# Patient Record
Sex: Male | Born: 1960 | Hispanic: Yes | Marital: Married | Smoking: Former smoker
Health system: Southern US, Community
[De-identification: ages and names within clinical notes are randomized; demographics above are authoritative.]

## PROBLEM LIST (undated history)

## (undated) DIAGNOSIS — E119 Type 2 diabetes mellitus without complications: Secondary | ICD-10-CM

## (undated) DIAGNOSIS — I10 Essential (primary) hypertension: Secondary | ICD-10-CM

---

## 2011-11-22 ENCOUNTER — Emergency Department: Payer: Self-pay | Admitting: Emergency Medicine

## 2011-11-22 LAB — COMPREHENSIVE METABOLIC PANEL
Albumin: 4.2 g/dL (ref 3.4–5.0)
Bilirubin,Total: 0.6 mg/dL (ref 0.2–1.0)
Calcium, Total: 9.7 mg/dL (ref 8.5–10.1)
Co2: 27 mmol/L (ref 21–32)
EGFR (African American): >60
Glucose: 107 mg/dL — ABNORMAL HIGH (ref 65–99)
Osmolality: 278 (ref 275–301)
Potassium: 3.6 mmol/L (ref 3.5–5.1)
Sodium: 139 mmol/L (ref 136–145)
Total Protein: 8.7 g/dL — ABNORMAL HIGH (ref 6.4–8.2)

## 2011-11-22 LAB — CBC
MCH: 29.7 pg (ref 26.0–34.0)
MCV: 85 fL (ref 80–100)
Platelet: 221 10*3/uL (ref 150–440)
RBC: 5.25 10*6/uL (ref 4.40–5.90)
RDW: 12.7 % (ref 11.5–14.5)

## 2011-11-22 LAB — URINALYSIS, COMPLETE
Bilirubin,UR: NEGATIVE
Blood: NEGATIVE
Ketone: NEGATIVE
Nitrite: NEGATIVE
Ph: 7 (ref 4.5–8.0)
RBC,UR: <1 /HPF (ref 0–5)
Specific Gravity: 1.009 (ref 1.003–1.030)
Squamous Epithelial: NONE SEEN
WBC UR: <1 /HPF (ref 0–5)

## 2012-06-27 LAB — CBC WITH DIFFERENTIAL/PLATELET
Eosinophil #: 0.1 10*3/uL (ref 0.0–0.7)
HCT: 35.7 % — ABNORMAL LOW (ref 40.0–52.0)
Lymphocyte #: 2.2 10*3/uL (ref 1.0–3.6)
Lymphocyte %: 18.3 %
MCHC: 34.7 g/dL (ref 32.0–36.0)
MCV: 85 fL (ref 80–100)
Monocyte #: 0.5 x10 3/mm (ref 0.2–1.0)
Neutrophil %: 76.8 %
Platelet: 214 10*3/uL (ref 150–440)
RBC: 4.2 10*6/uL — ABNORMAL LOW (ref 4.40–5.90)
RDW: 13.3 % (ref 11.5–14.5)
WBC: 12.2 10*3/uL — ABNORMAL HIGH (ref 3.8–10.6)

## 2012-06-27 LAB — COMPREHENSIVE METABOLIC PANEL
Albumin: 3.9 g/dL (ref 3.4–5.0)
BUN: 20 mg/dL — ABNORMAL HIGH (ref 7–18)
Bilirubin,Total: 0.4 mg/dL (ref 0.2–1.0)
Calcium, Total: 8.8 mg/dL (ref 8.5–10.1)
Chloride: 109 mmol/L — ABNORMAL HIGH (ref 98–107)
Co2: 23 mmol/L (ref 21–32)
Creatinine: 1.21 mg/dL (ref 0.60–1.30)
EGFR (Non-African Amer.): >60
Osmolality: 285 (ref 275–301)
Potassium: 4.2 mmol/L (ref 3.5–5.1)
SGPT (ALT): 32 U/L (ref 12–78)
Sodium: 140 mmol/L (ref 136–145)
Total Protein: 7.2 g/dL (ref 6.4–8.2)

## 2012-06-27 LAB — URINALYSIS, COMPLETE
Bilirubin,UR: NEGATIVE
Blood: NEGATIVE
Ketone: NEGATIVE
Nitrite: NEGATIVE
Ph: 5 (ref 4.5–8.0)
Protein: NEGATIVE

## 2012-06-28 ENCOUNTER — Inpatient Hospital Stay: Payer: Self-pay | Admitting: Internal Medicine

## 2012-06-28 LAB — CBC WITH DIFFERENTIAL/PLATELET
Basophil #: 0 10*3/uL (ref 0.0–0.1)
Basophil %: 0.5 %
Eosinophil #: 0.5 10*3/uL (ref 0.0–0.7)
HCT: 32.7 % — ABNORMAL LOW (ref 40.0–52.0)
HGB: 11.3 g/dL — ABNORMAL LOW (ref 13.0–18.0)
Lymphocyte #: 2.5 10*3/uL (ref 1.0–3.6)
Lymphocyte %: 33.4 %
MCH: 29.6 pg (ref 26.0–34.0)
MCHC: 34.5 g/dL (ref 32.0–36.0)
MCV: 86 fL (ref 80–100)
Monocyte #: 0.4 x10 3/mm (ref 0.2–1.0)
Monocyte %: 5.2 %
Neutrophil %: 54.6 %
Platelet: 178 10*3/uL (ref 150–440)
RBC: 3.8 10*6/uL — ABNORMAL LOW (ref 4.40–5.90)
RDW: 13 % (ref 11.5–14.5)
WBC: 7.6 10*3/uL (ref 3.8–10.6)

## 2012-06-29 LAB — BASIC METABOLIC PANEL
Anion Gap: 4 — ABNORMAL LOW (ref 7–16)
BUN: 10 mg/dL (ref 7–18)
Calcium, Total: 8.6 mg/dL (ref 8.5–10.1)
Co2: 29 mmol/L (ref 21–32)
Creatinine: 0.92 mg/dL (ref 0.60–1.30)
EGFR (African American): >60
EGFR (Non-African Amer.): >60
Glucose: 108 mg/dL — ABNORMAL HIGH (ref 65–99)
Potassium: 3.9 mmol/L (ref 3.5–5.1)

## 2012-06-29 LAB — CBC WITH DIFFERENTIAL/PLATELET
Basophil #: 0 10*3/uL (ref 0.0–0.1)
Basophil %: 0.7 %
Eosinophil #: 0.7 10*3/uL (ref 0.0–0.7)
Eosinophil %: 9.4 %
HGB: 11.5 g/dL — ABNORMAL LOW (ref 13.0–18.0)
Lymphocyte %: 23.7 %
MCH: 29.4 pg (ref 26.0–34.0)
MCHC: 34.3 g/dL (ref 32.0–36.0)
MCV: 86 fL (ref 80–100)
Monocyte #: 0.4 x10 3/mm (ref 0.2–1.0)
Neutrophil #: 4.2 10*3/uL (ref 1.4–6.5)
Platelet: 181 10*3/uL (ref 150–440)
RBC: 3.91 10*6/uL — ABNORMAL LOW (ref 4.40–5.90)

## 2012-06-29 LAB — TSH: Thyroid Stimulating Horm: 0.368 u[IU]/mL — ABNORMAL LOW

## 2014-05-07 NOTE — Consult Note (Signed)
PATIENT NAME:  [name], [name] MR#:  [id number] DATE OF BIRTH:  [birth year]  DATE OF CONSULTATION:  06/28/2012  REFERRING PHYSICIAN:   CONSULTING PHYSICIAN:  Midge Miniumarren Airam Heidecker, MD  CONSULTING SERVICE:  Gastroenterology.   REASON FOR CONSULTATION:  Rectal bleeding.   HISTORY OF PRESENT ILLNESS:  This patient is a 54 year old gentleman who reports that he had three episodes of rectal bleeding just prior to coming to the Emergency Department.  The patient states that he has never had the bleeding before.  He does report that 15 years ago he was told that he had a gastric ulcer.  The patient had a slight drop in his hemoglobin, but not dramatic.  The patient also denies any abdominal pain, fevers, chills, nausea or vomiting.  He does have a 10 pound weight loss over the last 8 months without trying.  He denies any family history of colon cancer, colon polyps.  He has never had a colonoscopy before.   PAST MEDICAL HISTORY:  Hypertension.   PAST SURGICAL HISTORY:  None.   ALLERGIES:  No known drug allergies.   HOME MEDICATIONS:  Lisinopril 10 mg a day.   SOCIAL HISTORY:  Denies tobacco, alcohol or drug use.   FAMILY HISTORY:  Noncontributory.    REVIEW OF SYSTEMS:   A 10 point review of systems negative except what is stated above.   PHYSICAL EXAMINATION:  GENERAL:   The patient is alert and orientated, answering questions appropriately, in no apparent distress.  VITAL SIGNS:  Temperature 98.5, pulse 60, respirations 18, blood pressure 167/94, pulse oximetry 100%.  HEENT:  Normocephalic, atraumatic.  Extraocular motor intact.  Pupils equally round and reactive to light and accommodation, without JVD, without lymphadenopathy.  LUNGS:  Clear to auscultation bilaterally.  HEART:  Regular rate and rhythm without murmurs, rubs or gallops.  ABDOMEN:  Soft, nontender, nondistended without hepatosplenomegaly.  EXTREMITIES:  Without cyanosis, clubbing or edema.  MUSCULOSKELETAL:  Good range of motion.   SKIN:  Without any rashes or lesion.  NEUROLOGICAL:  Grossly intact.   ANCILLARY SERVICES:  The patient's hemoglobin 12.4 on admission, 11.3 this morning.  Otherwise, he had a slightly elevated white cell count of 12.2, which is down to 7.6 this morning.   ASSESSMENT AND PLAN:  This patient is a 54 year old gentleman who has a history of one day of gastrointestinal bleeding from his rectum with three episodes happening prior to admission.  He has had no further gastrointestinal bleeding, diarrhea or other complaint since being in the hospital.  The patient has also had weight loss and never had a colonoscopy.  His history of peptic ulcer disease is unlikely the cause of his bleeding right now due to his relatively good hemoglobin and him not having hypotension.  The patient will be put on a clear liquid diet for today and tomorrow and start on a prep tomorrow for a colonoscopy on Monday.  The patient and his family have been explained the plan and agree with it.   Thank you very much for involving me in the care of this patient.  If you have any questions, please do not hesitate to call.    ____________________________ Midge Miniumarren Jabarri Stefanelli, MD dw:ea D: 06/28/2012 15:26:29 ET T: 06/29/2012 01:56:18 ET JOB#: [id number]  cc: Midge Miniumarren Victoire Deans, MD, <Dictator> Midge MiniumARREN Jodell Weitman MD ELECTRONICALLY SIGNED 06/29/2012 7:29

## 2014-05-07 NOTE — H&P (Signed)
PATIENT NAME:  [name], [name]  DATE OF ADMISSION:  06/28/2012  PRIMARY CARE PHYSICIAN: None.  REFERRING PHYSICIAN: Maricela BoLuna Ragsdale, MD   CHIEF COMPLAINT: Bright red blood per rectum.   HISTORY OF PRESENT ILLNESS: [name] is a 54 year old Hispanic male with a history of hypertension, who presented to the Emergency Department with complaints of bright red blood per rectum. The patient had 3 episodes prior to coming to the Emergency Department. They were small to large. The patient could not tell if this was mixed with stools. The patient states he did not have any previous episodes of GI bleed and did not have any colonoscopy in the past. Concerning this, the patient came to the Emergency Department. Workup in the Emergency Department: Initial hemoglobin was 11.3; however, the patient had CBC done in November 2013, and at that time, hemoglobin was 15.6. The patient denies having any abdominal pain, nausea, vomiting. The patient's blood pressure is well within normal limits; however, has been tachycardic. Denies using any over-the-counter pain medication.   PAST MEDICAL HISTORY: Hypertension.   PAST SURGICAL HISTORY: None.   ALLERGIES: No known drug allergies.   HOME MEDICATIONS: Lisinopril 10 mg once a day.   SOCIAL HISTORY: No history of smoking, drinking, alcohol or using illicit drugs.   FAMILY HISTORY: History of hypertension.   REVIEW OF SYSTEMS:  CONSTITUTIONAL: Denies having any generalized weakness, fatigue.  EYES: No change in vision.  EARS: No change in hearing.  RESPIRATORY: No cough, shortness of breath.  CARDIOVASCULAR: No chest pain, palpitations.  GASTROINTESTINAL: Has bright red blood per rectum. No abdominal pain.  GENITOURINARY: No dysuria or hematuria.  SKIN: No rash or lesions.  MUSCULOSKELETAL: No joint pains or aches.  ENDOCRINE: No polyuria or polydipsia.  NEUROLOGIC: No weakness or numbness in any part of the body.    PHYSICAL EXAMINATION:  GENERAL: This is a well-built, well-nourished, age-appropriate male lying down in the bed, not in distress.  VITAL SIGNS: Temperature 99.2, pulse 100, blood pressure 135/85, respiratory rate of 16, oxygen saturation is 98% on room air.  HEENT: Head normocephalic, atraumatic. Eyes: No scleral icterus. Conjunctivae normal. Pupils equal and reactive to light. Extraocular movements are intact. Mucous membranes moist. No pharyngeal erythema.  NECK: Supple. No lymphadenopathy. No JVD. No carotid bruit.  CHEST: Has no focal tenderness.  LUNGS: Bilaterally clear to auscultation.  HEART: S1 and S2 regular. No murmurs are heard. Tachycardia.  ABDOMEN: Bowel sounds present. Soft, nondistended. Has tenderness in the left lower quadrant. No guarding or rebound tenderness. Could not appreciate hepatosplenomegaly.  MUSCULOSKELETAL: Good range of motion.  SKIN: No rash or lesions.  NEUROLOGIC: The patient is alert, oriented to place, person and time. Cranial nerves II through XII intact. Motor 5/5 in upper and lower extremities.   LABORATORY DATA: CBC: WBC of 7.6, hemoglobin 11.3, platelet count of 214.   ASSESSMENT AND PLAN: [name] is a 54 year old male who comes to the Emergency Department with bright red blood per rectum.   1. Gastrointestinal bleed, seems to be most likely lower gastrointestinal as the patient has been hemodynamically stable for now. Significant drop in hemoglobin from 15 to 11.2. Will continue to follow up with H and H. Continue with IV fluids. Consult GI in the morning. If the patient continues to have recurrent bleeding and any signs of instability, will call GI emergently. Keep the patient n.p.o. for now.  2. Hypertension, well controlled. Will continue with the lisinopril once  we start him back on the diet.  3. Keep the patient on deep vein thrombosis prophylaxis with SCDs.  4. Anemia secondary to acute blood loss. Will continue to follow up. Will  transfuse for hemoglobin less than 8 or any rapid decline in the hemoglobin.   ____________________________ Nathaniel Griffins, MD pv:OSi D: 06/28/2012 07:20:09 ET T: 06/28/2012 08:41:07 ET JOB#: [id number]  cc: Nathaniel Griffins, MD, <Dictator> Nathaniel Griffins MD ELECTRONICALLY SIGNED 06/29/2012 8:09

## 2014-05-07 NOTE — Consult Note (Signed)
Brief Consult Note: Diagnosis: patient with h/o PUD 15 years ago now presented with LGIB with dark blood without any abd pain. He has lost 10 lbs in the last 8 months. No N/V. Has never had a colonoscopy. Bleeding was 3 times yesterday and has now resolved.   Patient was seen by consultant.   Consult note dictated.   Comments: Patient with lower GI bleeding. Will be put on clears today and tomorrow with prep tomorrow for a colonscopy on monday.  Electronic Signatures: Midge MiniumWohl, Denim Kalmbach (MD)  (Signed 14-Jun-14 12:15)  Authored: Brief Consult Note   Last Updated: 14-Jun-14 12:15 by Midge MiniumWohl, Lavone Weisel (MD)

## 2014-05-07 NOTE — Discharge Summary (Signed)
PATIENT NAME:  [name], [name] MR#:  [id number] DATE OF BIRTH:  [birth year]  DATE OF ADMISSION:  06/28/2012 DATE OF DISCHARGE:  06/30/2012  ADMITTING DIAGNOSIS: Gastrointestinal bleed.   DISCHARGE DIAGNOSES: 1.  Lower gastrointestinal bleed, status post colonoscopy on 06/30/2012 by Dr. Servando Snare showing  diverticulosis of entire colon, colon polyps which were resected and retrieved, internal hemorrhoids.  2.  Acute posthemorrhagic anemia.  3.  Leukocytosis, resolved.  4.  History of hypertension.   DISCHARGE CONDITION: Stable.   DISCHARGE MEDICATIONS: The patient is to resume lisinopril, however, the patient's dose was advanced to 10 mg p.o. twice daily dose.   NEW MEDICATIONS: Feosol 325 mg 1 tablet twice daily, Colace 100 mg p.o. twice daily, Senna 8.6 mg once daily, Anusol HC 25 mg suppository twice daily as needed.   HOME OXYGEN: None.   DIET: 2 grams salt high-fiber, regular consistency. The patient was recommended to avoid nuts, grains, as well as seeds.   ACTIVITY LIMITATIONS: As tolerated.   FOLLOW-UP APPOINTMENT: With Dr. Servando Snare in 1 week after discharge.   CONSULTANTS: Dr. Servando Snare, also Care Management.   LABORATORY AND RADIOLOGICAL STUDIES:  CT scan of abdomen and pelvis with contrast 06/27/2012 revealed no CT evidence of obstructive or inflammatory abnormalities.  The patient's lab data done on day of admission showed elevated glucose to 146 and BUN to 20, otherwise BMP was unremarkable. The patient's liver enzymes were normal. White blood cell count was slightly elevated to 12.2, hemoglobin was 12.4, platelet count was 214. Absolute neutrophil count was elevated at 9.3. Urinalysis was unremarkable.   HISTORY AND PHYSICAL:  The patient is a 54 year old Spanish male with past medical history significant for history of hypertension who presents to the hospital with complaints of bright red blood per rectum. Please refer to Dr. Clarita Leber admission note on 06/28/2012.     On arrival to  the hospital, the patient's temperature was 99.2, pulse was 100, respiration rate was 16, blood pressure 135/85, saturation was 98% on room air. Physical exam was unremarkable.   HOSPITAL COURSE:  The patient was admitted to the hospital for further evaluation.  Because of his history of diarrheal stool, he also had stool for C. diff checked which was negative. He was evaluated by gastroenterologist who felt the patient would benefit from colonoscopy. Colonoscopy was performed on 06/29/2012.  Diverticulosis of the entire examined colon was found.  Two 7 to 12 mm polyps in the transverse colon and the cecum were resected and retrieved.  Nonbleeding internal hemorrhoids were also noted.  Dr. Servando Snare recommended to repeat colonoscopy in 5 years if polyp reveals adenoma, in 10 years if hyperplastic polyp.  Postprocedure, the patient had no bleeding, and his diet was advanced, and he is being discharged home in stable condition with no  significant symptoms.   His vital signs on the day of discharge: Temperature 97.6 pulse was 60s to 70s, respiration rate was 18, blood pressure 120s to 140s systolic and 70s to 80s diastolic, and O2 saturations were 98 to 100% on room air at rest.   In regards to GI bleed, it was unclear where it was coming from; however, internal hemorrhoids as well as diverticulosis were implicated.   In regards to acute posthemorrhagic anemia, the patient's hemoglobin level was found to be 12.4 on the day of admission, 06/27/2012.  With  IV fluid administration, the  patient's hemoglobin level drifted somewhat lower; however, it remained stable over the past 3 days; and on the day of discharge,  06/30/2012, it was 11.3. The patient was advised to continue iron supplements for his anemia.   In regards to hypertension, the patient's blood pressure was found to be somewhat poorly controlled, and his lisinopril was advanced to twice daily dose. It is recommended to follow the patient's blood pressure  readings and make decisions about advancement of his medications if needed.  For leukocytosis, it was unclear why the patient had mild leukocytosis on admission 06/27/2012; however, leukocytosis resolved already on the next day, 06/28/2012 with no antibiotics.   The patient is being discharged in stable condition with the above mentioned medications and follow-up.   TIME SPENT: 40 minutes.    ____________________________ Katharina Caperima Leianne Callins, MD rv:cb D: 06/30/2012 16:46:03 ET T: 07/01/2012 00:01:42 ET JOB#: [id number]  cc: Katharina Caperima Arad Burston, MD, <Dictator> Midge Miniumarren Wohl, MD Daegan Arizmendi MD ELECTRONICALLY SIGNED 07/21/2012 12:28

## 2014-05-07 NOTE — Consult Note (Signed)
Chief Complaint:  Subjective/Chief Complaint Patient without any further bowel movements or bleeding since admission.   VITAL SIGNS/ANCILLARY NOTES: **Vital Signs.:   15-Jun-14 03:48  Vital Signs Type Routine  Temperature Temperature (F) 97.3  Celsius 36.2  Temperature Source oral  Pulse Pulse 55  Respirations Respirations 18  Systolic BP Systolic BP 834  Diastolic BP (mmHg) Diastolic BP (mmHg) 87  Mean BP 100  Pulse Ox % Pulse Ox % 98  Pulse Ox Activity Level  At rest  Oxygen Delivery Room Air/ 21 %   Brief Assessment:  GEN well developed   Cardiac Regular   Respiratory normal resp effort   Gastrointestinal Normal   Lab Results: Thyroid:  15-Jun-14 04:47   Thyroid Stimulating Hormone  0.368 (0.45-4.50 (International Unit)  ----------------------- Pregnant patients have  different reference  ranges for TSH:  - - - - - - - - - -  Pregnant, first trimetser:  0.36 - 2.50 uIU/mL)  Routine Chem:  15-Jun-14 04:47   Glucose, Serum  108  BUN 10  Creatinine (comp) 0.92  Sodium, Serum 141  Potassium, Serum 3.9  Chloride, Serum  108  CO2, Serum 29  Calcium (Total), Serum 8.6  Anion Gap  4  Osmolality (calc) 281  eGFR (African American) >60  eGFR (Non-African American) >60 (eGFR values <78m/min/1.73 m2 may be an indication of chronic kidney disease (CKD). Calculated eGFR is useful in patients with stable renal function. The eGFR calculation will not be reliable in acutely ill patients when serum creatinine is changing rapidly. It is not useful in  patients on dialysis. The eGFR calculation may not be applicable to patients at the low and high extremes of body sizes, pregnant women, and vegetarians.)  Hemoglobin A1c ([facility]) 6.3 (The American Diabetes Association recommends that a primary goal of therapy should be <7% and that physicians should reevaluate the treatment regimen in patients with HbA1c values consistently >8%.)  Routine Hem:  15-Jun-14 04:47   WBC  (CBC) 7.0  RBC (CBC)  3.91  Hemoglobin (CBC)  11.5  Hematocrit (CBC)  33.5  Platelet Count (CBC) 181  MCV 86  MCH 29.4  MCHC 34.3  RDW 12.9  Neutrophil % 60.6  Lymphocyte % 23.7  Monocyte % 5.6  Eosinophil % 9.4  Basophil % 0.7  Neutrophil # 4.2  Lymphocyte # 1.7  Monocyte # 0.4  Eosinophil # 0.7  Basophil # 0.0 (Result(s) reported on 29 Jun 2012 at 05:50AM.)   Assessment/Plan:  Assessment/Plan:  Assessment Lower GI bleed.   Plan Patient has had no further bleeding and his Hb has been stable. Patient to have colonoscopy tomorrow.   Electronic Signatures: WLucilla Lame(MD)  (Signed 15-Jun-14 07:46)  Authored: Chief Complaint, VITAL SIGNS/ANCILLARY NOTES, Brief Assessment, Lab Results, Assessment/Plan   Last Updated: 15-Jun-14 07:46 by WLucilla Lame(MD)

## 2015-01-01 IMAGING — CT CT ABD-PELV W/ CM
1 of 4 series · 13 of 32 positions shown, 19 images · IV contrast (isovue)
Comparison: none

REASON FOR EXAM: (1) LLQ tenderness; (2) LLQ tenderness
COMMENTS:

PROCEDURE:     CT  - CT ABDOMEN / PELVIS  W  - June 27, 2012 [DATE]
RESULT:     Technique: Helical 3 mm sections were obtained from the lung
bases through the superior iliac crest status post intravenous
administration of 100 mL Isovue 370.

[Series 2: 3mm soft tissue · axial · 0.78mm/px · z∈[-526,-118]mm · 13 of 158 slices shown, 19 images]
[im 11/158  soft-tissue]
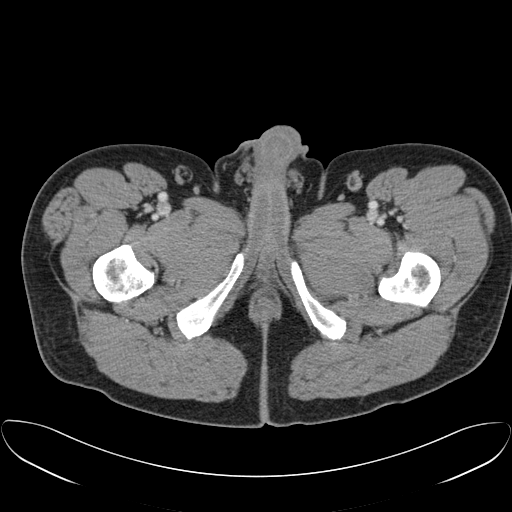
[im 11/158  bone]
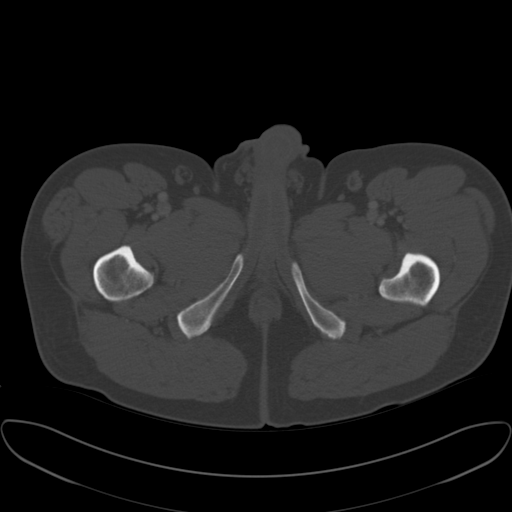
[im 21/158  soft-tissue]
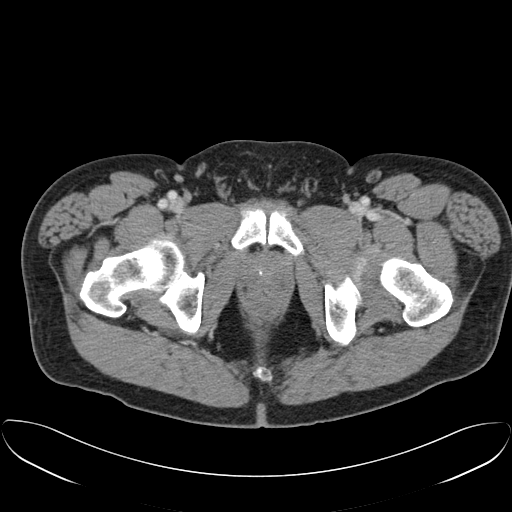
[im 32/158  soft-tissue]
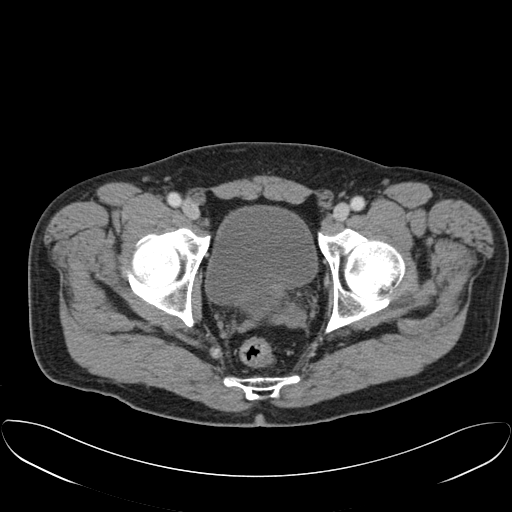
[im 42/158  soft-tissue]
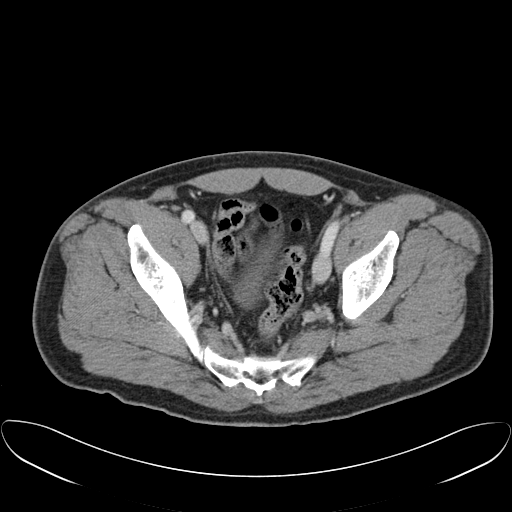
[im 53/158  soft-tissue]
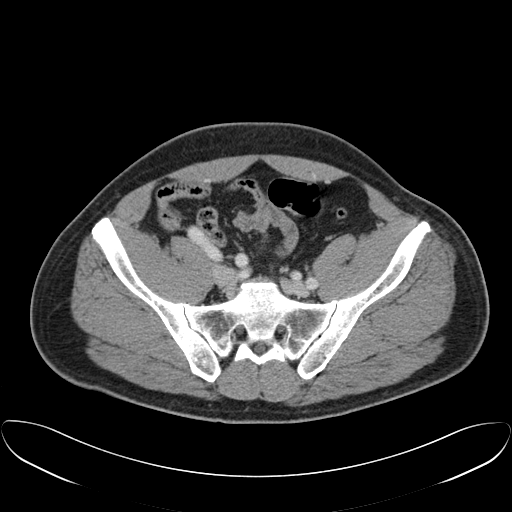
[im 63/158  soft-tissue]
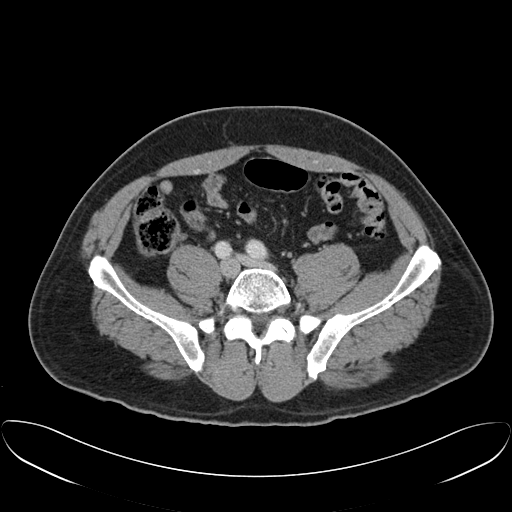
[im 84/158  soft-tissue]
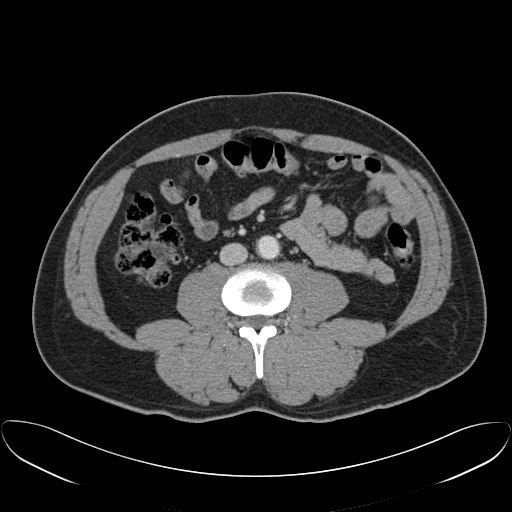
[im 95/158  soft-tissue]
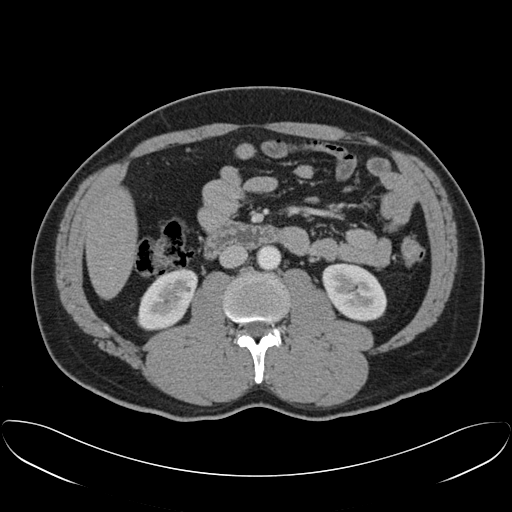
[im 105/158  soft-tissue]
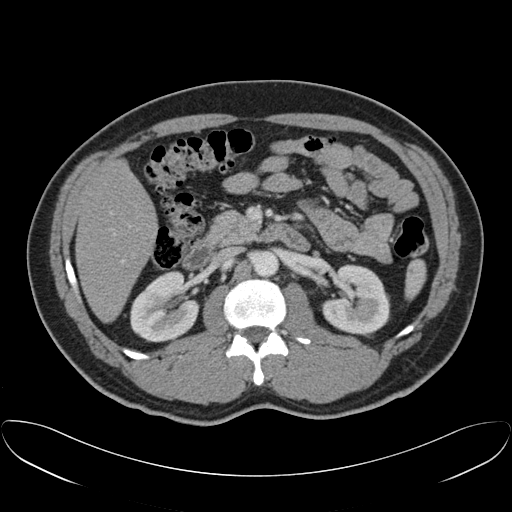
[im 105/158  bone]
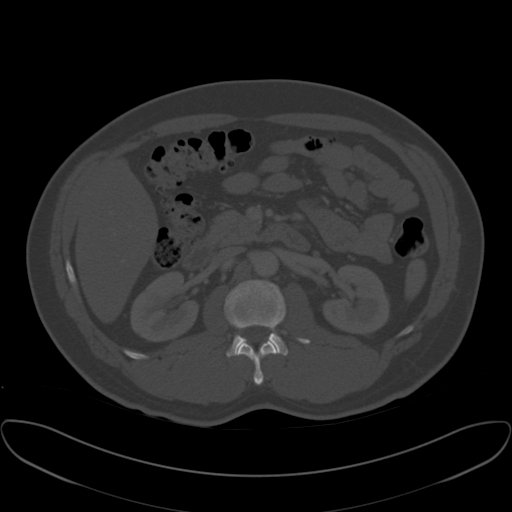
[im 116/158  soft-tissue]
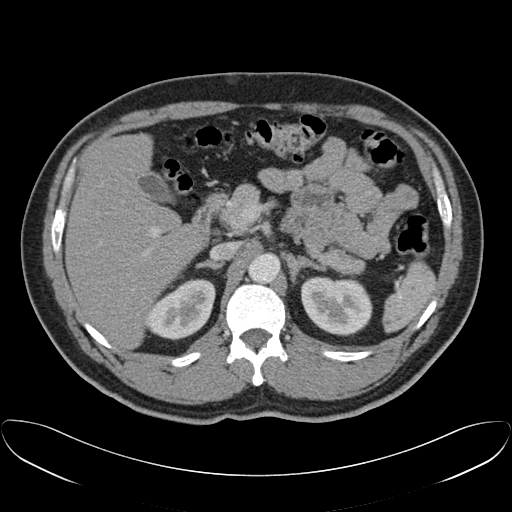
[im 116/158  lung]
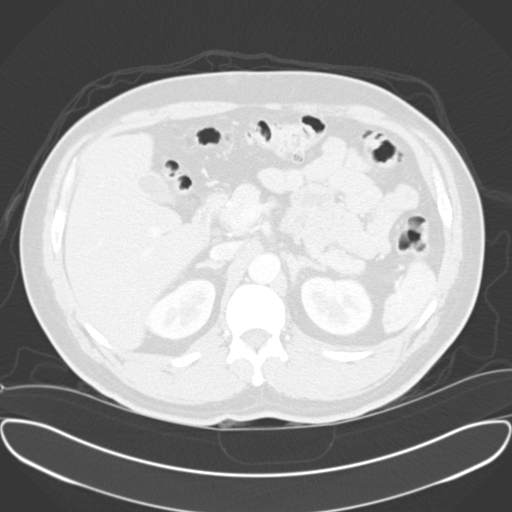
[im 126/158  soft-tissue]
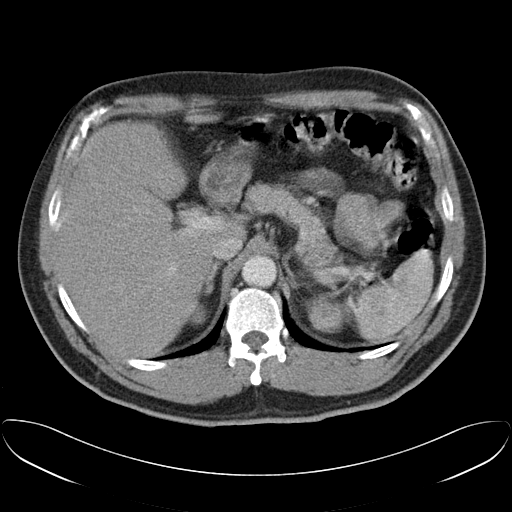
[im 126/158  lung]
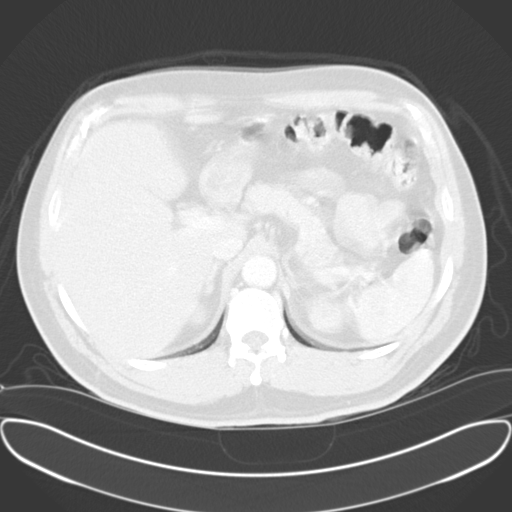
[im 137/158  soft-tissue]
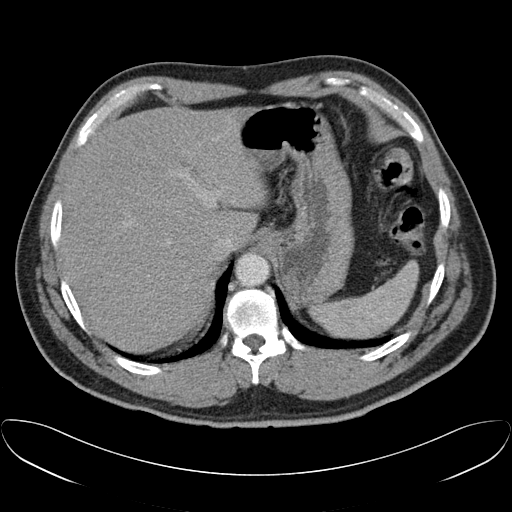
[im 137/158  lung]
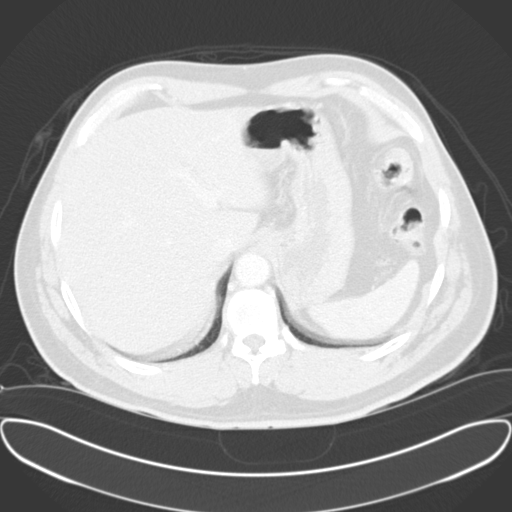
[im 147/158  soft-tissue]
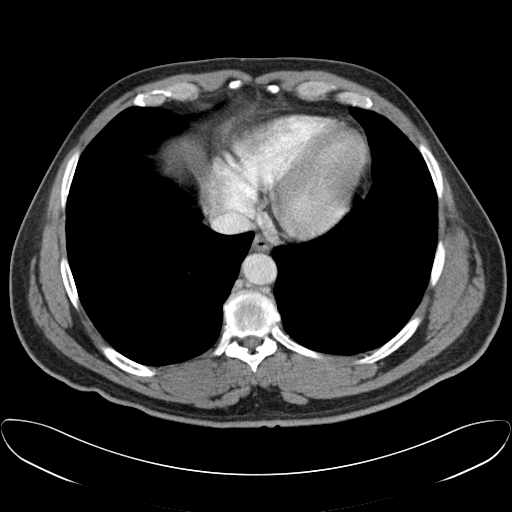
[im 147/158  lung]
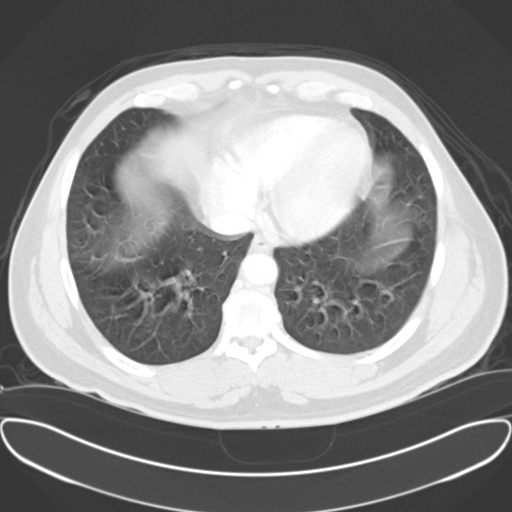

[13 of 32 positions shown; findings below may reference images not displayed]

FINDINGS: The lung bases are unremarkable.

The liver, spleen, adrenals, pancreas, and kidneys are unremarkable.

There is not evidence of bowel obstruction, enteritis, colitis nor
diverticulitis.

There is not evidence of an abdominal aortic aneurysm. The celiac, SMA, IMA,
portal vein, and SMV are opacified.

The gallbladder fossa is unremarkable.

There is no evidence of abdominal free fluid, loculated fluid collections,
masses nor adenopathy.
IMPRESSION: No CT evidence of obstructive or inflammatory abnormalities.
2. Dr. Mandavela of the emergency department was informed of these findings
via a preliminary faxed report.

## 2020-01-18 ENCOUNTER — Ambulatory Visit (INDEPENDENT_AMBULATORY_CARE_PROVIDER_SITE_OTHER): Payer: BLUE CROSS/BLUE SHIELD

## 2020-01-18 ENCOUNTER — Ambulatory Visit
Admission: EM | Admit: 2020-01-18 | Discharge: 2020-01-18 | Disposition: A | Discharge status: Home / Self Care | Payer: BLUE CROSS/BLUE SHIELD | Attending: Physician Assistant | Admitting: Physician Assistant

## 2020-01-18 ENCOUNTER — Other Ambulatory Visit: Payer: Self-pay

## 2020-01-18 ENCOUNTER — Encounter: Payer: Self-pay | Admitting: Emergency Medicine

## 2020-01-18 DIAGNOSIS — I10 Essential (primary) hypertension: Secondary | ICD-10-CM

## 2020-01-18 DIAGNOSIS — R208 Other disturbances of skin sensation: Secondary | ICD-10-CM | POA: Diagnosis present

## 2020-01-18 DIAGNOSIS — R079 Chest pain, unspecified: Secondary | ICD-10-CM | POA: Diagnosis not present

## 2020-01-18 HISTORY — DX: Type 2 diabetes mellitus without complications: E11.9

## 2020-01-18 HISTORY — DX: Essential (primary) hypertension: I10

## 2020-01-18 LAB — COMPREHENSIVE METABOLIC PANEL
ALT: 26 U/L (ref 0–44)
AST: 18 U/L (ref 15–41)
Albumin: 4.2 g/dL (ref 3.5–5.0)
Alkaline Phosphatase: 63 U/L (ref 38–126)
Anion gap: 12 (ref 5–15)
BUN: 16 mg/dL (ref 6–20)
CO2: 25 mmol/L (ref 22–32)
Calcium: 9 mg/dL (ref 8.9–10.3)
Chloride: 102 mmol/L (ref 98–111)
Creatinine, Ser: 0.92 mg/dL (ref 0.61–1.24)
GFR, Estimated: >60 mL/min (ref >60)
Glucose, Bld: 101 mg/dL — ABNORMAL HIGH (ref 70–99)
Potassium: 3.8 mmol/L (ref 3.5–5.1)
Sodium: 139 mmol/L (ref 135–145)
Total Bilirubin: 0.5 mg/dL (ref 0.3–1.2)
Total Protein: 7.8 g/dL (ref 6.5–8.1)

## 2020-01-18 LAB — CBC WITH DIFFERENTIAL/PLATELET
Abs Immature Granulocytes: 0.02 10*3/uL (ref 0.00–0.07)
Basophils Absolute: 0.1 10*3/uL (ref 0.0–0.1)
Basophils Relative: 1 %
Eosinophils Absolute: 1.4 10*3/uL — ABNORMAL HIGH (ref 0.0–0.5)
Eosinophils Relative: 16 %
HCT: 45.8 % (ref 39.0–52.0)
Hemoglobin: 15.5 g/dL (ref 13.0–17.0)
Immature Granulocytes: 0 %
Lymphocytes Relative: 30 %
Lymphs Abs: 2.6 10*3/uL (ref 0.7–4.0)
MCH: 28.9 pg (ref 26.0–34.0)
MCHC: 33.8 g/dL (ref 30.0–36.0)
MCV: 85.4 fL (ref 80.0–100.0)
Monocytes Absolute: 0.6 10*3/uL (ref 0.1–1.0)
Monocytes Relative: 7 %
Neutro Abs: 4 10*3/uL (ref 1.7–7.7)
Neutrophils Relative %: 46 %
Platelets: 210 10*3/uL (ref 150–400)
RBC: 5.36 MIL/uL (ref 4.22–5.81)
RDW: 12.7 % (ref 11.5–15.5)
WBC: 8.6 10*3/uL (ref 4.0–10.5)
nRBC: 0 % (ref 0.0–0.2)

## 2020-01-18 LAB — TROPONIN I (HIGH SENSITIVITY): Troponin I (High Sensitivity): 6 ng/L (ref <18)

## 2020-01-18 MED ORDER — LISINOPRIL 20 MG PO TABS: 20.0000 mg | ORAL_TABLET | Freq: Every day | ORAL | 0 refills | Status: DC

## 2020-01-18 NOTE — Discharge Instructions (Addendum)
Your chest x-ray is normal.  An EKG shows a right bundle branch block.  I am not sure if this is new or not.  This is something to follow-up with your PCP about and get repeat EKG.  If you have any increased pain in your chest or breathing difficulty or any dizziness or weakness you must call EMS or go to ED.  CBC and CMP are both normal.  I did also perform a troponin which is a cardiac enzyme. It was normal.  As discussed I think it is possible that you could have shingles without the rash. If that is the case it should get better with a little more time.  There are other conditions that can cause this disturbing sensation of your skin.  I would advise a follow-up with your PCP at the next available appointment for further work-up.  And putting back on your lisinopril.  Keep a log of blood pressures and follow-up with PCP.

## 2020-01-18 NOTE — ED Provider Notes (Signed)
[facility]    CSN: [id number] Arrival date & time: 01/18/20  1840      History   Chief Complaint Chief Complaint  Patient presents with  . Hypertension       . burning senstation of skin    HPI [name] is a 60 y.o. male presenting with daughter for concerns about elevated blood pressures.  Patient's daughter is helping to translate since the patient is Spanish-speaking only.  She says that his blood pressure was 176/113 when she checked it recently.  She states that he has been more anxious than normal.  She states that he is complaining of burning sensation of the left chest constantly for about 4 weeks.  He states that the pain is little worse when he takes a deep breath or twists and turns.  Nothing improves the discomfort.  This pain is not associated with any fever, fatigue, sweats, shortness of breath, palpitations, leg swelling, abdominal pain, heartburn, nausea/vomiting, or weakness.  They deny history of rash.  He does have a history of chickenpox about 20 years ago.  He does have history of hypertension and states that his PCP took him off of lisinopril a few months ago since his blood pressures were doing well.  He also has a history of prediabetes according to his daughter.  Patient last seen by PCP about a month ago and states he had normal exam and labs. They have no other concerns today.  HPI  Past Medical History:  Diagnosis Date  . Diabetes mellitus without complication (HCC)   . Hypertension     There are no problems to display for this patient.   History reviewed. No pertinent surgical history.     Home Medications    Prior to Admission medications   Medication Sig Start Date End Date Taking? Authorizing Provider  lisinopril (ZESTRIL) 20 MG tablet Take 1 tablet (20 mg total) by mouth daily. 01/18/20 04/17/20 Yes Shirlee Latch, PA-C  metFORMIN (GLUCOPHAGE-XR) 500 MG 24 hr tablet Take by mouth. 08/30/16  Yes [provider]     Family History History reviewed. No pertinent family history.  Social History Social History   Tobacco Use  . Smoking status: Former Games developer  . Smokeless tobacco: Never Used  Vaping Use  . Vaping Use: Never used  Substance Use Topics  . Alcohol use: Not Currently  . Drug use: Not Currently     Allergies   Patient has no known allergies.   Review of Systems Review of Systems  Constitutional: Negative for fatigue and fever.  HENT: Negative for congestion.   Eyes: Negative for visual disturbance.  Respiratory: Negative for cough, chest tightness, shortness of breath and wheezing.   Cardiovascular: Positive for chest pain (burning chest wall pain, sensitive skin). Negative for palpitations and leg swelling.  Gastrointestinal: Negative for abdominal pain, nausea and vomiting.  Musculoskeletal: Negative for back pain and myalgias.  Skin: Negative for color change and rash.  Neurological: Negative for dizziness, tremors, facial asymmetry, weakness, light-headedness, numbness and headaches.  Psychiatric/Behavioral: Negative for confusion.     Physical Exam Triage Vital Signs ED Triage Vitals  Enc Vitals Group     BP 01/18/20 2004 (!) 192/96     Pulse Rate 01/18/20 2004 65     Resp 01/18/20 2004 18     Temp 01/18/20 2004 98 F (36.7 C)     Temp Source 01/18/20 2004 Oral     SpO2 01/18/20 2004 99 %  Weight 01/18/20 1942 190 lb (86.2 kg)     Height 01/18/20 1942 5\' 6"  (1.676 m)     Head Circumference --      Peak Flow --      Pain Score 01/18/20 1941 6     Pain Loc --      Pain Edu? --      Excl. in GC? --    No data found.  Updated Vital Signs BP (!) 191/110 (BP Location: Right Arm)   Pulse 65   Temp 98 F (36.7 C) (Oral)   Resp 18   Ht 5\' 6"  (1.676 m)   Wt 190 lb (86.2 kg)   SpO2 99%   BMI 30.67 kg/m       Physical Exam Vitals and nursing note reviewed.  Constitutional:      General: He is not in acute distress.    Appearance: Normal  appearance. He is well-developed and well-nourished. He is not ill-appearing or toxic-appearing.  HENT:     Head: Normocephalic and atraumatic.     Mouth/Throat:     Mouth: Mucous membranes are moist.     Pharynx: Oropharynx is clear.  Eyes:     General: No scleral icterus.       Right eye: No discharge.     Conjunctiva/sclera: Conjunctivae normal.     Pupils: Pupils are equal, round, and reactive to light.  Cardiovascular:     Rate and Rhythm: Normal rate and regular rhythm.     Heart sounds: No murmur heard.   Pulmonary:     Effort: Pulmonary effort is normal. No respiratory distress.     Breath sounds: Normal breath sounds. No wheezing, rhonchi or rales.  Chest:     Chest wall: Tenderness (TTP with light touch of skin along T5-T6  left side, no rashes or skin discoloration. No swelling) present.  Abdominal:     Palpations: Abdomen is soft.     Tenderness: There is no abdominal tenderness.  Musculoskeletal:        General: No edema.     Cervical back: Neck supple.     Thoracic back: No tenderness. Normal range of motion.     Lumbar back: No tenderness. Normal range of motion.  Skin:    General: Skin is warm and dry.  Neurological:     General: No focal deficit present.     Mental Status: He is alert and oriented to person, place, and time. Mental status is at baseline.     Cranial Nerves: No cranial nerve deficit.     Motor: No weakness.     Gait: Gait normal.  Psychiatric:        Mood and Affect: Mood and affect and mood normal.        Behavior: Behavior normal.        Thought Content: Thought content normal.      UC Treatments / Results  Labs (all labs ordered are listed, but only abnormal results are displayed) Labs Reviewed  CBC WITH DIFFERENTIAL/PLATELET - Abnormal; Notable for the following components:      Result Value   Eosinophils Absolute 1.4 (*)    All other components within normal limits  COMPREHENSIVE METABOLIC PANEL - Abnormal; Notable for the  following components:   Glucose, Bld 101 (*)    All other components within normal limits  TROPONIN I (HIGH SENSITIVITY)    EKG   Radiology DG Chest 2 View  Result Date: 01/18/2020 CLINICAL DATA:  Hypertension.  Left chest pain. EXAM: CHEST - 2 VIEW COMPARISON:  November 22, 2011 FINDINGS: The heart size and mediastinal contours are within normal limits. Both lungs are clear. The visualized skeletal structures are unremarkable. IMPRESSION: No active cardiopulmonary disease. Electronically Signed   By: Katherine Mantle M.D.   On: 01/18/2020 21:02    Procedures ED EKG  Date/Time: 01/18/2020 9:10 PM Performed by: Shirlee Latch, PA-C Authorized by: Shirlee Latch, PA-C   Previous ECG:    Previous ECG:  Unavailable Interpretation:    Interpretation: abnormal   Rate:    ECG rate:  62   ECG rate assessment: normal   Rhythm:    Rhythm: sinus rhythm   Ectopy:    Ectopy: none   QRS:    QRS axis:  Normal   QRS intervals:  Normal   QRS conduction: RBBB   ST segments:    ST segments:  Normal T waves:    T waves: normal   Comments:     Sinus rhythm, regular rate, RBBB   (including critical care time)  Medications Ordered in UC Medications - No data to display  Initial Impression / Assessment and Plan / UC Course  I have reviewed the triage vital signs and the nursing notes.  Pertinent labs & imaging results that were available during my care of the patient were reviewed by me and considered in my medical decision making (see chart for details).   60 year old male presenting for elevated blood pressures.  Blood pressure is elevated at 191/110 in the clinic.  All other vital signs are normal and stable.  He states he was previously on lisinopril.  Unable to access other medical records to review.  Patient also does complain of "chest burning" that is constant over the past month.  He has sensitivity along the T4-T5 dermatome with light palpation.  There are no overlying  rashes or swelling.  His chest is clear to auscultation and heart regular rate and rhythm.  EKG performed today is normal sinus rhythm and rate with right bundle branch block.  Unable to view any previous EKGs to determine if this is new finding or not.  Chest x-ray performed and is within normal limits. Independently reviewed by me.  CBC is within normal limits today. CMP performed and glucose elevated at 101, otherwise normal labs Troponin performed and WNL  Top differentials include zoster sine herpete, herniated disc of t-spine or other spinal condition, vitamin deficiency, electrolyte imbalance, other neurological condition  Discussed all test results with patient and his daughter.  They had a chance to ask questions.  Advised him that he should follow-up with his PCP regarding the right bundle branch block on the EKG and this skin sensation.  ED precautions thoroughly reviewed with patient and daughter.  Final Clinical Impressions(s) / UC Diagnoses   Final diagnoses:  Essential hypertension  Burning sensation of skin     Discharge Instructions     Your chest x-ray is normal.  An EKG shows a right bundle branch block.  I am not sure if this is new or not.  This is something to follow-up with your PCP about and get repeat EKG.  If you have any increased pain in your chest or breathing difficulty or any dizziness or weakness you must call EMS or go to ED.  CBC and CMP are both normal.  I did also perform a troponin which is a cardiac enzyme. It was normal.  As discussed I think it is  possible that you could have shingles without the rash. If that is the case it should get better with a little more time.  There are other conditions that can cause this disturbing sensation of your skin.  I would advise a follow-up with your PCP at the next available appointment for further work-up.  And putting back on your lisinopril.  Keep a log of blood pressures and follow-up with PCP.    ED  Prescriptions    Medication Sig Dispense Auth. Provider   lisinopril (ZESTRIL) 20 MG tablet Take 1 tablet (20 mg total) by mouth daily. 90 tablet Gareth Morgan     PDMP not reviewed this encounter.   Shirlee Latch, PA-C 01/19/20 [phone]

## 2020-01-18 NOTE — ED Triage Notes (Signed)
Pt c/o burning sensation on his left nipple, and elevated BP/ daughter states he has been anxious and his BP has been 176/113. BP started today and the burning sensation started about 4 weeks ago.

## 2020-01-18 NOTE — ED Notes (Signed)
EKG performed and given to [name]

## 2021-02-01 ENCOUNTER — Other Ambulatory Visit: Payer: Self-pay

## 2021-02-01 ENCOUNTER — Ambulatory Visit
Admission: EM | Admit: 2021-02-01 | Discharge: 2021-02-01 | Disposition: A | Discharge status: Home / Self Care | Payer: BLUE CROSS/BLUE SHIELD | Attending: Emergency Medicine | Admitting: Emergency Medicine

## 2021-02-01 DIAGNOSIS — Z760 Encounter for issue of repeat prescription: Secondary | ICD-10-CM

## 2021-02-01 DIAGNOSIS — I10 Essential (primary) hypertension: Secondary | ICD-10-CM

## 2021-02-01 MED ORDER — LISINOPRIL 20 MG PO TABS: 20.0000 mg | ORAL_TABLET | Freq: Every day | ORAL | 0 refills | Status: DC

## 2021-02-01 NOTE — ED Triage Notes (Signed)
Patient is here for "High Blood Pressure". Taken at home "this morning" (160/95?). History of High Blood Pressure during previous visit, d/c'd previous BP med by PCP. ? Needs to restart (per patient). No ha. No dizziness.

## 2021-02-01 NOTE — Discharge Instructions (Addendum)
We will give patient a refill for blood pressure medicine.  He states that he has metformin still does not need a refill on this. Will send in for assistance for patient to have a new PCP per his request. Expressed to patient that his blood pressure gets elevated at the weakness to 1 side or a headache he needs to be seen in the emergency room.

## 2021-02-01 NOTE — ED Provider Notes (Signed)
[facility]    CSN: [id number] Arrival date & time: 02/01/21  0855      History   Chief Complaint Chief Complaint  Patient presents with   Hypertension    HPI [name] is a 61 y.o. male.   Patient is here for refill on lisinopril blood pressure medicine.  States that when he was seen here we placed him on medications he had done fine.  Seen his PCP his PCP did not want him to continue the blood pressure medicine patient reports PCP told him that his blood pressure was fine without the medication.  Patient states that he does better on the medicines.  Patient currently does not have no headache no chest pain no symptoms.  Patient is requesting to have a new PCP that would fill the medication for him.   Past Medical History:  Diagnosis Date   Diabetes mellitus without complication ([place])    Hypertension     There are no problems to display for this patient.   History reviewed. No pertinent surgical history.     Home Medications    Prior to Admission medications   Medication Sig Start Date End Date Taking? Authorizing Provider  lisinopril (ZESTRIL) 20 MG tablet Take 1 tablet (20 mg total) by mouth daily. 02/01/21 05/02/21  Marney Setting, NP  metFORMIN (GLUCOPHAGE-XR) 500 MG 24 hr tablet Take by mouth. 08/30/16   [provider]    Family History No family history on file.  Social History Social History   Tobacco Use   Smoking status: Former   Smokeless tobacco: Never  Scientific laboratory technician Use: Never used  Substance Use Topics   Alcohol use: Not Currently   Drug use: Not Currently     Allergies   Patient has no known allergies.   Review of Systems Review of Systems  Constitutional: Negative.   Respiratory: Negative.    Cardiovascular: Negative.   Gastrointestinal: Negative.   Genitourinary: Negative.   Musculoskeletal: Negative.   Neurological: Negative.     Physical Exam Triage Vital Signs ED Triage Vitals  Enc  Vitals Group     BP 02/01/21 0938 (!) 190/106     Pulse Rate 02/01/21 0938 83     Resp 02/01/21 0938 18     Temp 02/01/21 0938 98.9 F (37.2 C)     Temp Source 02/01/21 0938 Oral     SpO2 02/01/21 0938 100 %     Weight 02/01/21 0942 180 lb (81.6 kg)     Height 02/01/21 0942 5\' 1"  (1.549 m)     Head Circumference --      Peak Flow --      Pain Score 02/01/21 0938 0     Pain Loc --      Pain Edu? --      Excl. in [place]? --    No data found.  Updated Vital Signs BP (!) 167/115 (BP Location: Left Arm) Comment: #2 of 2   Pulse 83    Temp 98.9 F (37.2 C) (Oral)    Resp 18    Ht 5\' 1"  (1.549 m)    Wt 180 lb (81.6 kg)    SpO2 100%    BMI 34.01 kg/m   Visual Acuity Right Eye Distance:   Left Eye Distance:   Bilateral Distance:    Right Eye Near:   Left Eye Near:    Bilateral Near:     Physical Exam Constitutional:  Appearance: Normal appearance.  HENT:     Right Ear: Tympanic membrane normal.     Left Ear: Tympanic membrane normal.  Eyes:     Pupils: Pupils are equal, round, and reactive to light.  Cardiovascular:     Rate and Rhythm: Normal rate.  Pulmonary:     Effort: Pulmonary effort is normal.  Abdominal:     General: Abdomen is flat.  Skin:    General: Skin is warm.  Neurological:     General: No focal deficit present.     Mental Status: He is alert. Mental status is at baseline.     UC Treatments / Results  Labs (all labs ordered are listed, but only abnormal results are displayed) Labs Reviewed - No data to display  EKG   Radiology No results found.  Procedures Procedures (including critical care time)  Medications Ordered in UC Medications - No data to display  Initial Impression / Assessment and Plan / UC Course  I have reviewed the triage vital signs and the nursing notes.  Pertinent labs & imaging results that were available during my care of the patient were reviewed by me and considered in my medical decision making (see chart for  details).     We will give patient a refill for blood pressure medicine.  He states that he has metformin still does not need a refill on this. Will send in for assistance for patient to have a new PCP per his request. Expressed to patient that his blood pressure gets elevated at the weakness to 1 side or a headache he needs to be seen in the emergency room. Final Clinical Impressions(s) / UC Diagnoses   Final diagnoses:  Hypertension, unspecified type  Medication refill     Discharge Instructions      We will give patient a refill for blood pressure medicine.  He states that he has metformin still does not need a refill on this. Will send in for assistance for patient to have a new PCP per his request. Expressed to patient that his blood pressure gets elevated at the weakness to 1 side or a headache he needs to be seen in the emergency room.     ED Prescriptions     Medication Sig Dispense Auth. Provider   lisinopril (ZESTRIL) 20 MG tablet Take 1 tablet (20 mg total) by mouth daily. 90 tablet Marney Setting, NP      PDMP not reviewed this encounter.   Marney Setting, NP 02/01/21 1125

## 2021-02-02 ENCOUNTER — Telehealth: Payer: Self-pay

## 2021-02-02 NOTE — Telephone Encounter (Signed)
-----   Message from Curt Jews, RN sent at 02/02/2021 11:22 AM EST ----- Regarding: UC to PCP Patient needs to establish with PCP - routine

## 2021-02-02 NOTE — Telephone Encounter (Signed)
Spoke to patient he said he does not need pcp right now

## 2021-07-29 ENCOUNTER — Emergency Department
Admission: EM | Admit: 2021-07-29 | Discharge: 2021-07-29 | Disposition: A | Discharge status: Home / Self Care | Payer: 59 | Attending: Emergency Medicine | Admitting: Emergency Medicine

## 2021-07-29 ENCOUNTER — Other Ambulatory Visit: Payer: Self-pay

## 2021-07-29 ENCOUNTER — Emergency Department: Payer: 59

## 2021-07-29 DIAGNOSIS — Z23 Encounter for immunization: Secondary | ICD-10-CM | POA: Insufficient documentation

## 2021-07-29 DIAGNOSIS — W268XXA Contact with other sharp object(s), not elsewhere classified, initial encounter: Secondary | ICD-10-CM | POA: Diagnosis not present

## 2021-07-29 DIAGNOSIS — S61215A Laceration without foreign body of left ring finger without damage to nail, initial encounter: Secondary | ICD-10-CM | POA: Diagnosis not present

## 2021-07-29 DIAGNOSIS — Y9389 Activity, other specified: Secondary | ICD-10-CM | POA: Insufficient documentation

## 2021-07-29 DIAGNOSIS — S6992XA Unspecified injury of left wrist, hand and finger(s), initial encounter: Secondary | ICD-10-CM | POA: Diagnosis present

## 2021-07-29 MED ORDER — CEPHALEXIN 500 MG PO CAPS: 500.0000 mg | ORAL_CAPSULE | Freq: Three times a day (TID) | ORAL | 0 refills | Status: AC

## 2021-07-29 MED ORDER — TETANUS-DIPHTH-ACELL PERTUSSIS 5-2.5-18.5 LF-MCG/0.5 IM SUSY
0.5000 mL | PREFILLED_SYRINGE | Freq: Once | INTRAMUSCULAR | Status: AC
  Administered 2021-07-29: 0.5 mL via INTRAMUSCULAR
  Filled 2021-07-29: qty 0.5

## 2021-07-29 NOTE — ED Notes (Signed)
Pt to ED for laceration to left ring finger from metal piece that fell on finger while working on car. Finger has small amount of fresh blood oozing from laceration but is not bleeding profusely. Pt in NAD. Xray has already been done.

## 2021-07-29 NOTE — ED Provider Notes (Signed)
[facility] Provider Note  Patient Contact: 7:03 PM (approximate)   History   Laceration   HPI  [name] is a 61 y.o. male presents to the emergency department with a laceration of the left ring finger sustained with a heavy car part the patient was attempting to lift on his own.  No fingernail involvement.  No numbness or tingling in the left hand.  Patient cannot recall his last tetanus shot.      Physical Exam   Triage Vital Signs: ED Triage Vitals  Enc Vitals Group     BP 07/29/21 1747 (!) 168/104     Pulse Rate 07/29/21 1743 64     Resp 07/29/21 1743 16     Temp 07/29/21 1743 98.3 F (36.8 C)     Temp Source 07/29/21 1743 Oral     SpO2 07/29/21 1743 98 %     Weight 07/29/21 1744 182 lb (82.6 kg)     Height 07/29/21 1744 5\' 4"  (1.626 m)     Head Circumference --      Peak Flow --      Pain Score 07/29/21 1744 8     Pain Loc --      Pain Edu? --      Excl. in GC? --     Most recent vital signs: Vitals:   07/29/21 1743 07/29/21 1747  BP:  (!) 168/104  Pulse: 64   Resp: 16   Temp: 98.3 F (36.8 C)   SpO2: 98%      General: Alert and in no acute distress. Eyes:  PERRL. EOMI. Head: No acute traumatic findings ENT:      Nose: No congestion/rhinnorhea.      Mouth/Throat: Mucous membranes are moist. Neck: No stridor. No cervical spine tenderness to palpation. Cardiovascular:  Good peripheral perfusion Respiratory: Normal respiratory effort without tachypnea or retractions. Lungs CTAB. Good air entry to the bases with no decreased or absent breath sounds. Gastrointestinal: Bowel sounds 4 quadrants. Soft and nontender to palpation. No guarding or rigidity. No palpable masses. No distention. No CVA tenderness. Musculoskeletal: Full range of motion to all extremities.  Neurologic:  No gross focal neurologic deficits are appreciated.  Skin: Patient has 3.5 cm V-shaped left ring finger laceration.  Laceration is deep to underlying  adipose tissue. Other:   ED Results / Procedures / Treatments   Labs (all labs ordered are listed, but only abnormal results are displayed) Labs Reviewed - No data to display     RADIOLOGY  I personally viewed and evaluated these images as part of my medical decision making, as well as reviewing the written report by the radiologist.  ED Provider Interpretation: I personally interpreted x-ray of left ring finger and there were no acute bony abnormalities visualized.   PROCEDURES:  Critical Care performed: No  ..Laceration Repair  Date/Time: 07/29/2021 7:05 PM  Performed by: 07/31/2021, PA-C Authorized by: Orvil Feil, PA-C   Consent:    Consent obtained:  Verbal   Risks discussed:  Infection Universal protocol:    Procedure explained and questions answered to patient or proxy's satisfaction: yes     Patient identity confirmed:  Verbally with patient Anesthesia:    Anesthesia method:  None Laceration details:    Location:  Finger   Finger location:  L ring finger   Length (cm):  3.5   Depth (mm):  3 Pre-procedure details:    Preparation:  Patient was prepped and draped in  usual sterile fashion Exploration:    Limited defect created (wound extended): no     Contaminated: yes   Treatment:    Area cleansed with:  Povidone-iodine   Amount of cleaning:  Extensive   Irrigation solution:  Sterile saline   Visualized foreign bodies/material removed: no     Debridement:  None Skin repair:    Repair method:  Steri-Strips   Number of Steri-Strips:  2 Approximation:    Approximation:  Close Repair type:    Repair type:  Simple Post-procedure details:    Dressing:  Non-adherent dressing and splint for protection    MEDICATIONS ORDERED IN ED: Medications - No data to display   IMPRESSION / MDM / ASSESSMENT AND PLAN / ED COURSE  I reviewed the triage vital signs and the nursing notes.                              Assessment and plan Finger  laceration 60 year old male presents to the emergency department with a partially macerated laceration of the left ring finger.  Patient was hypertensive at triage but vital signs otherwise reassuring.  Patient was alert, active and nontoxic-appearing.  Wound was copiously irrigated in the emergency department cleansed with surgical soap.  Patient's wound was repaired using derma clips after he declined sutures.  His tetanus status was updated in the emergency department and patient was discharged with Keflex.  Patient education regarding wound care was given.  All patient questions were answered.      FINAL CLINICAL IMPRESSION(S) / ED DIAGNOSES   Final diagnoses:  Laceration of left ring finger without foreign body, nail damage status unspecified, initial encounter     Rx / DC Orders   ED Discharge Orders          Ordered    cephALEXin (KEFLEX) 500 MG capsule  3 times daily        07/29/21 1859             Note:  This document was prepared using Dragon voice recognition software and may include unintentional dictation errors.   Pia Mau [place], PA-C 07/29/21 Brock Ra, MD 07/29/21 2112

## 2021-07-29 NOTE — ED Triage Notes (Addendum)
Pt with laceration to middle of left ring finger which happened while pt was working on car. Pt states it was a metal piece that cut his finger. Pt states metal piece fell on his finger. Pt states he is not on blood thinners and he does not remember his last tetanus shot.

## 2021-07-29 NOTE — Discharge Instructions (Addendum)
Take Keflex 3 times daily for the next 7 days. You can keep wound wrapped for the next 48 hours. After 48 hours, remove wrapping and you can shower normally. Eventually, derma clip will come off on its own.  You do not have to remove derma clip until it falls off.

## 2021-07-29 NOTE — ED Notes (Signed)
Pt offered interpreter during d/c education but pt/family refused. Pt attempted to sign for d/c paperwork and education but topaz frozen.

## 2022-07-24 IMAGING — CR DG CHEST 2V
2 series · 2 of 2 positions shown · non-contrast
Comparison: November 22, 2011

CLINICAL DATA: Hypertension.  Left chest pain.

EXAM:
CHEST - 2 VIEW

[chest pa]
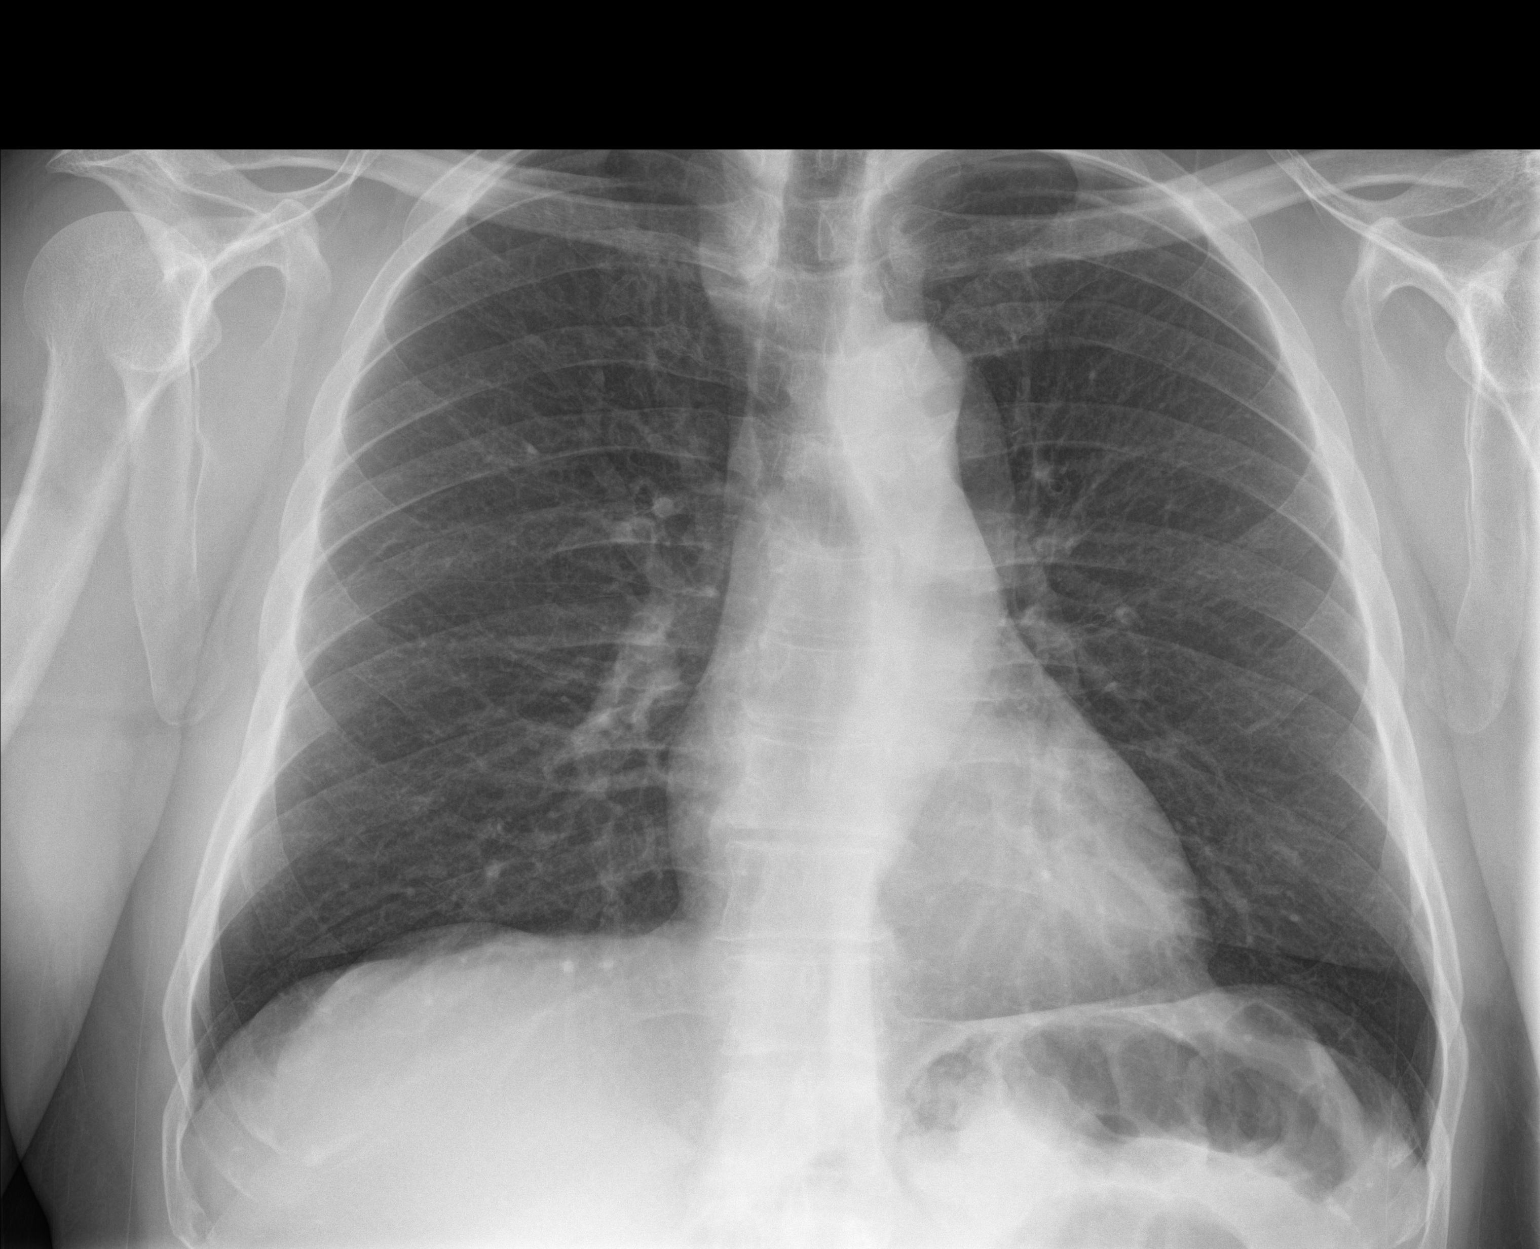

[chest lat]
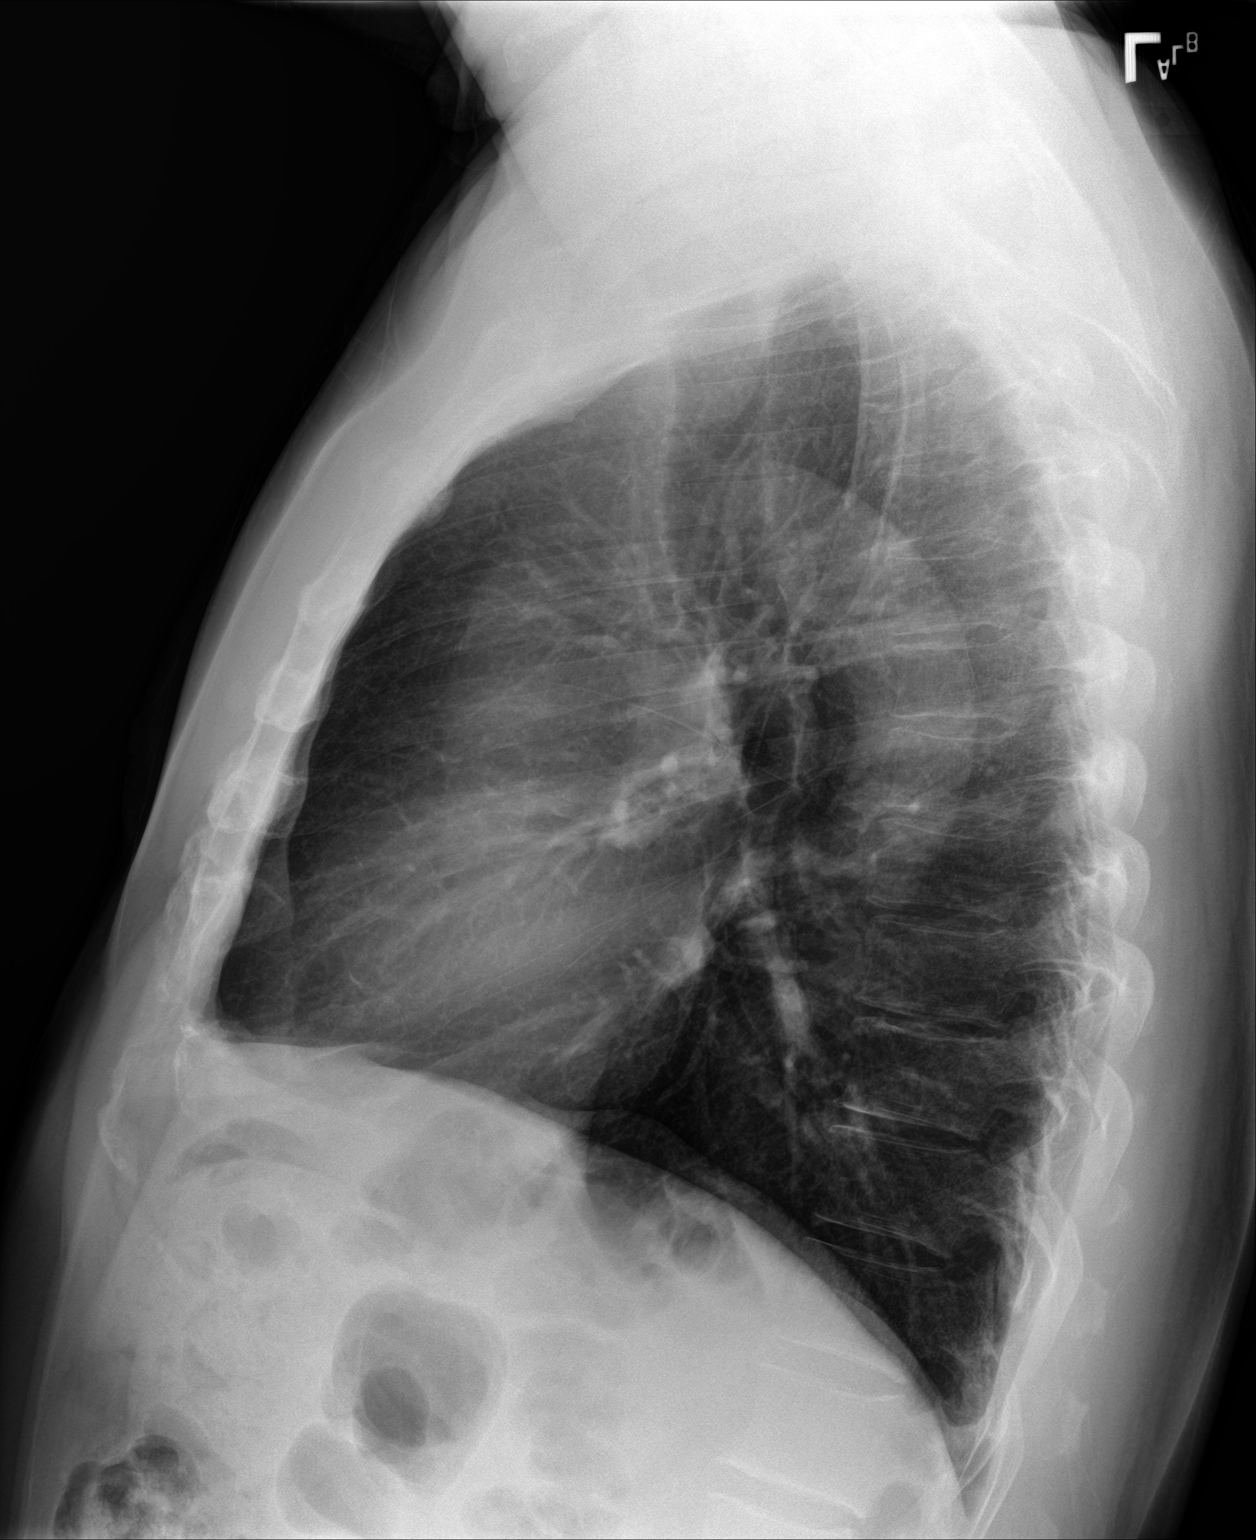

[2 of 2 positions shown; findings below may reference images not displayed]

FINDINGS: The heart size and mediastinal contours are within normal limits.
Both lungs are clear. The visualized skeletal structures are
unremarkable.
IMPRESSION: No active cardiopulmonary disease.

## 2022-11-09 DIAGNOSIS — E785 Hyperlipidemia, unspecified: Secondary | ICD-10-CM | POA: Diagnosis not present

## 2022-11-09 DIAGNOSIS — M109 Gout, unspecified: Secondary | ICD-10-CM | POA: Diagnosis not present

## 2022-11-09 DIAGNOSIS — Z0001 Encounter for general adult medical examination with abnormal findings: Secondary | ICD-10-CM | POA: Diagnosis not present

## 2022-11-09 DIAGNOSIS — Z1331 Encounter for screening for depression: Secondary | ICD-10-CM | POA: Diagnosis not present

## 2022-11-09 DIAGNOSIS — E118 Type 2 diabetes mellitus with unspecified complications: Secondary | ICD-10-CM | POA: Diagnosis not present

## 2022-11-09 DIAGNOSIS — Z125 Encounter for screening for malignant neoplasm of prostate: Secondary | ICD-10-CM | POA: Diagnosis not present

## 2022-11-09 DIAGNOSIS — I10 Essential (primary) hypertension: Secondary | ICD-10-CM | POA: Diagnosis not present

## 2023-05-24 ENCOUNTER — Encounter: Payer: Self-pay | Admitting: Physician Assistant

## 2023-05-24 ENCOUNTER — Ambulatory Visit (INDEPENDENT_AMBULATORY_CARE_PROVIDER_SITE_OTHER): Payer: Self-pay | Admitting: Physician Assistant

## 2023-05-24 VITALS — BP 150/94 | HR 64 | Temp 98.5°F | Resp 16 | Ht 64.0 in | Wt 172.0 lb

## 2023-05-24 DIAGNOSIS — Z1329 Encounter for screening for other suspected endocrine disorder: Secondary | ICD-10-CM | POA: Diagnosis not present

## 2023-05-24 DIAGNOSIS — E1165 Type 2 diabetes mellitus with hyperglycemia: Secondary | ICD-10-CM

## 2023-05-24 DIAGNOSIS — E782 Mixed hyperlipidemia: Secondary | ICD-10-CM | POA: Diagnosis not present

## 2023-05-24 DIAGNOSIS — R5383 Other fatigue: Secondary | ICD-10-CM

## 2023-05-24 DIAGNOSIS — I10 Essential (primary) hypertension: Secondary | ICD-10-CM | POA: Diagnosis not present

## 2023-05-24 DIAGNOSIS — Z7689 Persons encountering health services in other specified circumstances: Secondary | ICD-10-CM

## 2023-05-24 NOTE — Progress Notes (Signed)
 [facility] [address] [place], [place] [address]  Internal MEDICINE  Office Visit Note  Patient Name: [name]  [id number]  [id number]  Date of Service: 05/24/2023   Complaints/HPI Pt is here for establishment of PCP. Chief Complaint  Patient presents with   New Patient (Initial Visit)    Saturday started throwing up, dizzy. Maybe sugar drop    HPI Pt is here to establish care, his daughter is with him and assists with translation -he was being followed at [facility], but insurance changed so needed to establish elsewhere -hx of HTN and T2DM -Over the weekend got dizzy, and had vomiting, and forgot what he was doing. Had not eaten or had hydration that day and [place].  -[place] had recently been in [place] and was seen in clinic there and found to have higher A1c and cholesterol than last seen at PCP 6 months ago as well as low platelets -he reports recently having the flu a few weeks ago and appetite is down some since being sick this past weekend. -Not dizzy or nauseous today. 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 -has lactose sensitivity-- these make him feel sick so he avoids milk/cheese/yogurt -BP at home 2 days ago  was high at 180/103 after his medication. Improved some today, but still high in office. Reports typically higher in office and will watch closely. Discussed if bp staying high then will take 1.5tabs lisinopril  and keep log of BP and BG -Sleeping well -hx of gout as well -will order labs including A1c and microalbumin  Current Medication: Outpatient Encounter Medications as of 05/24/2023  Medication Sig   lisinopril  (ZESTRIL ) 20 MG tablet Take 1 tablet (20 mg total) by mouth daily.   metFORMIN (GLUCOPHAGE-XR) 500 MG 24 hr tablet Take by mouth. (Patient not taking: Reported on 05/24/2023)   No facility-administered encounter medications on file as of 05/24/2023.     Surgical History: History reviewed. No pertinent surgical history.  Medical History: Past Medical History:  Diagnosis Date   Diabetes mellitus without complication (HCC)    Hypertension     Family History: History reviewed. No pertinent family history.  Social History   Socioeconomic History   Marital status: Married    Spouse name: Not on file   Number of children: Not on file   Years of education: Not on file   Highest education level: Not on file  Occupational History   Not on file  Tobacco Use   Smoking status: Former   Smokeless tobacco: Never  Vaping Use   Vaping status: Never Used  Substance and Sexual Activity   Alcohol use: Not Currently   Drug use: Not Currently   Sexual activity: Not on file  Other Topics Concern   Not on file  Social History Narrative   Not on file   Social Drivers of Health   Financial Resource Strain: Low Risk  (11/09/2022)   Received from [facility]   Overall Financial Resource Strain (CARDIA)    Difficulty of Paying Living Expenses: Not very hard  Food Insecurity: No Food Insecurity (11/09/2022)   Received from [facility]   Hunger Vital Sign    Worried About Running Out of Food in the Last Year: Never true    Ran Out of Food in the Last Year: Never true  Transportation Needs: No Transportation Needs (11/09/2022)  Received from [facility] - Transportation    In the past 12 months, has lack of transportation kept you from medical appointments or from getting medications?: No    Lack of Transportation (Non-Medical): No  Physical Activity: Unknown (11/02/2016)   Received from [facility]   Exercise Vital Sign    Days of Exercise per Week: Patient declined    Minutes of Exercise per Session: Patient declined  Stress: Stress Concern Present (11/02/2016)   Received from [facility]  - Occupational Stress Questionnaire    Feeling of Stress : To some extent  Social Connections: Unknown (09/08/2018)   Received from [facility]   Social Connection and Isolation Panel [NHANES]    Frequency of Communication with Friends and Family: Not on file    Frequency of Social Gatherings with Friends and Family: Not on file    Attends Religious Services: Not on file    Active Member of Clubs or Organizations: Not on file    Attends Banker Meetings: Not on file    Marital Status: Married  Intimate Partner Violence: Not At Risk (09/08/2018)   [facility]   Humiliation, Afraid, Rape, and Kick questionnaire    Fear of Current or Ex-Partner: No    Emotionally Abused: No    Physically Abused: No    Sexually Abused: No     Review of Systems  Constitutional:  Negative for chills, fatigue and unexpected weight change.  HENT:  Negative for congestion, rhinorrhea, sneezing and sore throat.   Eyes:  Negative for redness.  Respiratory:  Negative for cough, chest tightness and shortness of breath.   Cardiovascular:  Negative for chest pain and palpitations.  Gastrointestinal:  Negative for abdominal pain, constipation, diarrhea, nausea and vomiting.  Genitourinary:  Negative for dysuria and frequency.  Musculoskeletal:  Negative for arthralgias, back pain, joint swelling and neck pain.  Skin:  Negative for rash.  Neurological: Negative.  Negative for tremors and numbness.  Hematological:  Negative for adenopathy. Does not bruise/bleed easily.  Psychiatric/Behavioral:  Negative for behavioral problems (Depression), sleep disturbance and suicidal ideas. The patient is not nervous/anxious.     Vital Signs: BP (!) 150/94   Pulse 64   Temp 98.5 F (36.9 C)   Resp 16   Ht 5\' 4"  (1.626 m)   Wt 172 lb (78 kg)   SpO2 98%   BMI 29.52 kg/m    Physical Exam Vitals and nursing note reviewed.  Constitutional:      General: He is not in acute distress.     Appearance: He is well-developed. He is not diaphoretic.  HENT:     Head: Normocephalic and atraumatic.  Eyes:     Extraocular Movements: Extraocular movements intact.  Neck:     Thyroid: No thyromegaly.     Vascular: No JVD.     Trachea: No tracheal deviation.  Cardiovascular:     Rate and Rhythm: Normal rate and regular rhythm.     Heart sounds: Normal heart sounds. No murmur heard.    No friction rub. No gallop.  Pulmonary:     Effort: Pulmonary effort is normal. No respiratory distress.     Breath sounds: No wheezing or rales.  Chest:     Chest wall: No tenderness.  Musculoskeletal:        General: Normal range of motion.  Skin:    General:  Skin is warm and dry.  Neurological:     General: No focal deficit present.     Mental Status: He is alert.  Psychiatric:        Behavior: Behavior normal.        Thought Content: Thought content normal.        Judgment: Judgment normal.       Assessment/Plan: 1. Type 2 diabetes mellitus with hyperglycemia, without long-term current use of insulin (HCC) (Primary) Will update A1c, has been off metformin for the last year since A1c well controlled. May need to restart if rising again. Discussed ensuring he is eating and drinking at regular intervals - Hgb A1C w/o eAG - Urine Microalbumin w/creat. ratio  2. Essential hypertension BP elevated in office, but had been well controlled until 2 days ago. Will keep log at home. Advised to take 1.5 tabs lisinopril  if staying elevated at home.  3. Thyroid disorder screen - TSH + free T4  4. Mixed hyperlipidemia - Lipid Panel With LDL/HDL Ratio  5. Other fatigue - CBC w/Diff/Platelet - Comprehensive metabolic panel with GFR - TSH + free T4 - Lipid Panel With LDL/HDL Ratio - Hgb A1C w/o eAG  6. Encounter to establish care with new provider Will order labs   General Counseling: Zedekiah verbalizes understanding of the findings of todays visit and agrees with plan of treatment. I have  discussed any further diagnostic evaluation that may be needed or ordered today. We also reviewed his medications today. he has been encouraged to call the office with any questions or concerns that should arise related to todays visit.    Counseling:    Orders Placed This Encounter  Procedures   CBC w/Diff/Platelet   Comprehensive metabolic panel with GFR   TSH + free T4   Lipid Panel With LDL/HDL Ratio   Hgb A1C w/o eAG   Urine Microalbumin w/creat. ratio    No orders of the defined types were placed in this encounter.    This patient was seen by Taylor Favia, PA-C in collaboration with Dr. Verneta Gone as a part of collaborative care agreement.   Time spent:35 Minutes

## 2023-06-07 LAB — CBC WITH DIFFERENTIAL/PLATELET
Basophils Absolute: 0.1 10*3/uL (ref 0.0–0.2)
Basos: 1 %
EOS (ABSOLUTE): 0.6 10*3/uL — ABNORMAL HIGH (ref 0.0–0.4)
Eos: 9 %
Hematocrit: 45.6 % (ref 37.5–51.0)
Hemoglobin: 15.3 g/dL (ref 13.0–17.7)
Immature Grans (Abs): 0 10*3/uL (ref 0.0–0.1)
Immature Granulocytes: 0 %
Lymphocytes Absolute: 2 10*3/uL (ref 0.7–3.1)
Lymphs: 27 %
MCH: 30.2 pg (ref 26.6–33.0)
MCHC: 33.6 g/dL (ref 31.5–35.7)
MCV: 90 fL (ref 79–97)
Monocytes Absolute: 0.4 10*3/uL (ref 0.1–0.9)
Monocytes: 6 %
Neutrophils Absolute: 4.1 10*3/uL (ref 1.4–7.0)
Neutrophils: 57 %
Platelets: 196 10*3/uL (ref 150–450)
RBC: 5.07 x10E6/uL (ref 4.14–5.80)
RDW: 13.2 % (ref 11.6–15.4)
WBC: 7.3 10*3/uL (ref 3.4–10.8)

## 2023-06-07 LAB — LIPID PANEL WITH LDL/HDL RATIO
Cholesterol, Total: 229 mg/dL — ABNORMAL HIGH (ref 100–199)
HDL: 47 mg/dL (ref >39)
LDL Chol Calc (NIH): 155 mg/dL — ABNORMAL HIGH (ref 0–99)
LDL/HDL Ratio: 3.3 ratio (ref 0.0–3.6)
Triglycerides: 151 mg/dL — ABNORMAL HIGH (ref 0–149)
VLDL Cholesterol Cal: 27 mg/dL (ref 5–40)

## 2023-06-07 LAB — COMPREHENSIVE METABOLIC PANEL WITH GFR
ALT: 22 IU/L (ref 0–44)
AST: 19 IU/L (ref 0–40)
Albumin: 4.6 g/dL (ref 3.9–4.9)
Alkaline Phosphatase: 70 IU/L (ref 44–121)
BUN/Creatinine Ratio: 15 (ref 10–24)
BUN: 14 mg/dL (ref 8–27)
Bilirubin Total: 0.7 mg/dL (ref 0.0–1.2)
CO2: 23 mmol/L (ref 20–29)
Calcium: 9.6 mg/dL (ref 8.6–10.2)
Chloride: 103 mmol/L (ref 96–106)
Creatinine, Ser: 0.95 mg/dL (ref 0.76–1.27)
Globulin, Total: 2.8 g/dL (ref 1.5–4.5)
Glucose: 109 mg/dL — ABNORMAL HIGH (ref 70–99)
Potassium: 4.3 mmol/L (ref 3.5–5.2)
Sodium: 140 mmol/L (ref 134–144)
Total Protein: 7.4 g/dL (ref 6.0–8.5)
eGFR: 91 mL/min/{1.73_m2} (ref >59)

## 2023-06-07 LAB — MICROALBUMIN / CREATININE URINE RATIO
Creatinine, Urine: 29.9 mg/dL
Microalb/Creat Ratio: 11 mg/g{creat} (ref 0–29)
Microalbumin, Urine: 3.3 ug/mL

## 2023-06-07 LAB — TSH+FREE T4
Free T4: 1.29 ng/dL (ref 0.82–1.77)
TSH: 0.522 u[IU]/mL (ref 0.450–4.500)

## 2023-06-07 LAB — HGB A1C W/O EAG: Hgb A1c MFr Bld: 6.1 % — ABNORMAL HIGH (ref 4.8–5.6)

## 2023-06-19 ENCOUNTER — Ambulatory Visit: Payer: Self-pay | Admitting: Physician Assistant

## 2023-07-05 ENCOUNTER — Ambulatory Visit: Admitting: Physician Assistant

## 2023-07-05 ENCOUNTER — Encounter: Payer: Self-pay | Admitting: Physician Assistant

## 2023-07-05 VITALS — BP 142/82 | HR 63 | Temp 98.7°F | Resp 16 | Ht 64.0 in | Wt 176.0 lb

## 2023-07-05 DIAGNOSIS — E782 Mixed hyperlipidemia: Secondary | ICD-10-CM | POA: Diagnosis not present

## 2023-07-05 DIAGNOSIS — I10 Essential (primary) hypertension: Secondary | ICD-10-CM

## 2023-07-05 DIAGNOSIS — E1165 Type 2 diabetes mellitus with hyperglycemia: Secondary | ICD-10-CM

## 2023-07-05 MED ORDER — ROSUVASTATIN CALCIUM 5 MG PO TABS: 5.0000 mg | ORAL_TABLET | Freq: Every day | ORAL | 3 refills | Status: AC

## 2023-07-05 NOTE — Progress Notes (Signed)
 [facility] [address] [place], [place] [address]  Internal MEDICINE  Office Visit Note  Patient Name: [name]  [id number]  [id number]  Date of Service: 07/05/2023  Chief Complaint  Patient presents with   Follow-up    Review labs   Diabetes   Hypertension   Quality Metric Gaps    Shingles vaccine and colonoscopy    HPI Pt is here for routine follow up to review labs, his daughter is with him and assists with translation -Glucose a little high at 109, A1c at 6.1 in prediabetic range and has been off of metformin for over a year and will continue to hold and work on diet and exercise -cholesterol very elevated will start statin -BP always higher in office. BP at home 139/82 recently even with stress. May increase to 1.5 tabs lisinopril  if rising -some stress with lack of work recently  Current Medication: Outpatient Encounter Medications as of 07/05/2023  Medication Sig   lisinopril  (ZESTRIL ) 20 MG tablet Take 1 tablet (20 mg total) by mouth daily.   metFORMIN (GLUCOPHAGE-XR) 500 MG 24 hr tablet Take by mouth.   No facility-administered encounter medications on file as of 07/05/2023.    Surgical History: History reviewed. No pertinent surgical history.  Medical History: Past Medical History:  Diagnosis Date   Diabetes mellitus without complication (HCC)    Hypertension     Family History: History reviewed. No pertinent family history.  Social History   Socioeconomic History   Marital status: Married    Spouse name: Not on file   Number of children: Not on file   Years of education: Not on file   Highest education level: Not on file  Occupational History   Not on file  Tobacco Use   Smoking status: Former   Smokeless tobacco: Never  Vaping Use   Vaping status: Never Used  Substance and Sexual Activity   Alcohol use: Not Currently   Drug use: Not Currently   Sexual activity: Not on file  Other Topics Concern   Not on file  Social  History Narrative   Not on file   Social Drivers of Health   Financial Resource Strain: Low Risk  (11/09/2022)   Received from [facility]   Overall Financial Resource Strain (CARDIA)    Difficulty of Paying Living Expenses: Not very hard  Food Insecurity: No Food Insecurity (11/09/2022)   Received from [facility]   Hunger Vital Sign    Within the past 12 months, you worried that your food would run out before you got the money to buy more.: Never true    Within the past 12 months, the food you bought just didn't last and you didn't have money to get more.: Never true  Transportation Needs: No Transportation Needs (11/09/2022)   Received from [facility] - Transportation    In the past 12 months, has lack of transportation kept you from medical appointments or from getting medications?: No    Lack of Transportation (Non-Medical): No  Physical Activity: Unknown (11/02/2016)   Received from [facility]   Exercise Vital Sign    Days of Exercise per Week: Patient declined    Minutes of Exercise per Session: Patient declined  Stress: Stress Concern Present (11/02/2016)   Received from [facility] - Occupational Stress Questionnaire    Feeling of Stress : To some extent  Social Connections: Unknown (09/08/2018)   Received from [facility]   Social Connection and Isolation Panel    Frequency of Communication with Friends and Family: Not on file    Frequency of Social Gatherings with Friends and Family: Not on file    Attends Religious Services: Not on file    Active Member of Clubs or Organizations: Not on file    Attends Club or Organization Meetings: Not on file    Are you married, widowed, divorced, separated, never married, or living with a partner?: Married  Intimate Partner Violence: Not At Risk (09/08/2018)   Received from [facility]   Humiliation, Afraid, Rape, and Kick questionnaire    Within the last year, have you been afraid of your partner or ex-partner?: No    Within the last year, have you been humiliated or emotionally abused in other ways by your partner or ex-partner?: No    Within the last year, have you been kicked, hit, slapped, or otherwise physically hurt by your partner or ex-partner?: No    Within the last year, have you been raped or forced to have any kind of sexual activity by your partner or ex-partner?: No      Review of Systems  Constitutional:  Negative for chills, fatigue and unexpected weight change.  HENT:  Negative for congestion, rhinorrhea, sneezing and sore throat.   Eyes:  Negative for redness.  Respiratory:  Negative for cough, chest tightness and shortness of breath.   Cardiovascular:  Negative for chest pain and palpitations.  Gastrointestinal:  Negative for abdominal pain, constipation, diarrhea, nausea and vomiting.  Genitourinary:  Negative for dysuria and frequency.  Musculoskeletal:  Negative for arthralgias, back pain, joint swelling and neck pain.  Skin:  Negative for rash.  Neurological: Negative.  Negative for tremors and numbness.  Hematological:  Negative for adenopathy. Does not bruise/bleed easily.  Psychiatric/Behavioral:  Negative for behavioral problems (Depression), sleep disturbance and suicidal ideas. The patient is not nervous/anxious.     Vital Signs: BP (!) 143/100   Pulse 63   Temp 98.7 F (37.1 C)   Resp 16   Ht 5' 4 (1.626 m)   Wt 176 lb (79.8 kg)   SpO2 97%   BMI 30.21 kg/m    Physical Exam Vitals and nursing note reviewed.  Constitutional:      General: He is not in acute distress.    Appearance: He is well-developed. He is not diaphoretic.  HENT:     Head: Normocephalic and atraumatic.   Eyes:     Extraocular Movements: Extraocular movements intact.   Neck:     Thyroid: No thyromegaly.     Vascular: No JVD.     Trachea: No  tracheal deviation.   Cardiovascular:     Rate and Rhythm: Normal rate and regular rhythm.     Heart sounds: Normal heart sounds. No murmur heard.    No friction rub. No gallop.  Pulmonary:     Effort: Pulmonary effort is normal. No respiratory distress.     Breath sounds: No wheezing or rales.  Chest:     Chest wall: No tenderness.   Musculoskeletal:        General: Normal range of motion.   Skin:    General: Skin is warm and dry.   Neurological:     General: No focal deficit present.     Mental Status: He is alert.   Psychiatric:        Behavior:  Behavior normal.        Thought Content: Thought content normal.        Judgment: Judgment normal.        Assessment/Plan: 1. Essential hypertension (Primary) Does have white coat hypertension, but improving and will bring cuff and log next visit to compare. May increase to 1.5 tabs lisinopril   2. Type 2 diabetes mellitus with hyperglycemia, without long-term current use of insulin (HCC) Will hold off on medication since A1c stable and will monitor and continue to control with diet and exercise  3. Mixed hyperlipidemia Will start crestor 2days per week and increase as tolerated to daily - rosuvastatin (CRESTOR) 5 MG tablet; Take 1 tablet (5 mg total) by mouth daily.  Dispense: 90 tablet; Refill: 3   General Counseling: Jerrod verbalizes understanding of the findings of todays visit and agrees with plan of treatment. I have discussed any further diagnostic evaluation that may be needed or ordered today. We also reviewed his medications today. he has been encouraged to call the office with any questions or concerns that should arise related to todays visit.    No orders of the defined types were placed in this encounter.   No orders of the defined types were placed in this encounter.   This patient was seen by Taylor Favia, PA-C in collaboration with Dr. Verneta Gone as a part of collaborative care agreement.   Total  time spent:30 Minutes Time spent includes review of chart, medications, test results, and follow up plan with the patient.      Dr Fozia M Khan Internal medicine

## 2023-08-02 ENCOUNTER — Ambulatory Visit: Admitting: Physician Assistant

## 2023-09-05 ENCOUNTER — Ambulatory Visit: Admitting: Physician Assistant

## 2023-11-27 ENCOUNTER — Other Ambulatory Visit: Payer: Self-pay

## 2023-11-27 MED ORDER — LISINOPRIL 20 MG PO TABS: 20.0000 mg | ORAL_TABLET | Freq: Every day | ORAL | 0 refills | Status: DC

## 2023-12-02 ENCOUNTER — Encounter: Payer: Self-pay | Admitting: Physician Assistant

## 2023-12-02 ENCOUNTER — Ambulatory Visit (INDEPENDENT_AMBULATORY_CARE_PROVIDER_SITE_OTHER): Admitting: Physician Assistant

## 2023-12-02 VITALS — BP 135/70 | HR 69 | Temp 98.0°F | Resp 16 | Ht 64.0 in | Wt 180.4 lb

## 2023-12-02 DIAGNOSIS — I10 Essential (primary) hypertension: Secondary | ICD-10-CM | POA: Diagnosis not present

## 2023-12-02 DIAGNOSIS — M109 Gout, unspecified: Secondary | ICD-10-CM | POA: Diagnosis not present

## 2023-12-02 DIAGNOSIS — Z0100 Encounter for examination of eyes and vision without abnormal findings: Secondary | ICD-10-CM

## 2023-12-02 DIAGNOSIS — E119 Type 2 diabetes mellitus without complications: Secondary | ICD-10-CM

## 2023-12-02 DIAGNOSIS — E1165 Type 2 diabetes mellitus with hyperglycemia: Secondary | ICD-10-CM | POA: Diagnosis not present

## 2023-12-02 DIAGNOSIS — Z23 Encounter for immunization: Secondary | ICD-10-CM

## 2023-12-02 LAB — POCT GLYCOSYLATED HEMOGLOBIN (HGB A1C): Hemoglobin A1C: 6.1 % — AB (ref 4.0–5.6)

## 2023-12-02 MED ORDER — LISINOPRIL 20 MG PO TABS: 20.0000 mg | ORAL_TABLET | Freq: Every day | ORAL | 3 refills | Status: AC

## 2023-12-02 MED ORDER — SHINGRIX 50 MCG/0.5ML IM SUSR: 0.5000 mL | Freq: Once | INTRAMUSCULAR | 0 refills | Status: AC

## 2023-12-02 MED ORDER — COLCHICINE 0.6 MG PO TABS: ORAL_TABLET | ORAL | 1 refills | Status: AC

## 2023-12-02 MED ORDER — PREVNAR 20 0.5 ML IM SUSY: 0.5000 mL | PREFILLED_SYRINGE | INTRAMUSCULAR | 0 refills | Status: AC

## 2023-12-02 NOTE — Progress Notes (Signed)
 [facility] [address] [place], [place] [address]  Internal MEDICINE  Office Visit Note  Patient Name: [name]  [id number]  [id number]  Date of Service: 12/02/2023  Chief Complaint  Patient presents with   Follow-up   Diabetes   Hypertension   Gout    Started last week, out of medication for it.    HPI Pt is here for routine follow up -not checking BG regularly, tries to eat well.  -not taking metformin for the last year and still doing well without it -tolerating crestor  1x per week -gout flare recently, several joints impacted. Both feet impacted now. Had not had flare in a long time. Think it may have been triggered by having to eat fast food one day. 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 Then may take 1 tablet by mouth daily for 7 days.   rosuvastatin  (CRESTOR ) 5 MG tablet Take 1 tablet (5 mg total) by mouth daily.   [DISCONTINUED] lisinopril  (ZESTRIL ) 20 MG tablet Take 1 tablet (20 mg total) by mouth daily.   [DISCONTINUED] metFORMIN (GLUCOPHAGE-XR) 500 MG 24 hr tablet Take by mouth.   [DISCONTINUED] pneumococcal 20-valent conjugate vaccine (PREVNAR 20) 0.5 ML injection Inject 0.5 mLs into the muscle tomorrow at 10 am.   [DISCONTINUED] Zoster Vaccine Adjuvanted [facility]) injection Inject 0.5 mLs into the muscle once.   lisinopril  (ZESTRIL ) 20 MG tablet Take 1 tablet (20 mg total) by mouth daily.   pneumococcal 20-valent conjugate vaccine (PREVNAR 20) 0.5 ML injection Inject 0.5 mLs into the muscle tomorrow at 10 am for 1 dose.   Zoster Vaccine Adjuvanted [facility]) injection Inject 0.5 mLs into the muscle once for 1 dose.   No facility-administered encounter medications on file as of  12/02/2023.    Surgical History: History reviewed. No pertinent surgical history.  Medical History: Past Medical History:  Diagnosis Date   Diabetes mellitus without complication (HCC)    Hypertension     Family History: History reviewed. No pertinent family history.  Social History   Socioeconomic History   Marital status: Married    Spouse name: Not on file   Number of children: Not on file   Years of education: Not on file   Highest education level: Not on file  Occupational History   Not on file  Tobacco Use   Smoking status: Former   Smokeless tobacco: Never  Vaping Use   Vaping status: Never Used  Substance and Sexual Activity   Alcohol use: Not Currently   Drug use: Not Currently   Sexual activity: Not on file  Other Topics Concern   Not on file  Social History Narrative   Not on file   Social Drivers of Health   Financial Resource Strain: Low Risk  (11/09/2022)   Received from [facility]   Overall Financial Resource Strain (CARDIA)    Difficulty of Paying Living Expenses: Not very hard  Food Insecurity: No Food Insecurity (11/09/2022)   Received from [facility]   Hunger Vital Sign    Within the past 12 months, you worried that your food would run out before you got the money to buy more.: Never true    Within the  past 12 months, the food you bought just didn't last and you didn't have money to get more.: Never true  Transportation Needs: No Transportation Needs (11/09/2022)   Received from [facility] - Transportation    In the past 12 months, has lack of transportation kept you from medical appointments or from getting medications?: No    Lack of Transportation (Non-Medical): No  Physical Activity: Unknown (11/02/2016)   Received from [facility]   Exercise Vital Sign    Days of Exercise per Week: Patient declined    Minutes of Exercise per Session: Patient declined   Stress: Stress Concern Present (11/02/2016)   Received from [facility] - Occupational Stress Questionnaire    Feeling of Stress : To some extent  Social Connections: Unknown (09/08/2018)   Received from [facility]   Social Connection and Isolation Panel    Frequency of Communication with Friends and Family: Not on file    Frequency of Social Gatherings with Friends and Family: Not on file    Attends Religious Services: Not on file    Active Member of Clubs or Organizations: Not on file    Attends Club or Organization Meetings: Not on file    Are you married, widowed, divorced, separated, never married, or living with a partner?: Married  Intimate Partner Violence: Not At Risk (09/08/2018)   Received from [facility]   Humiliation, Afraid, Rape, and Kick questionnaire    Within the last year, have you been afraid of your partner or ex-partner?: No    Within the last year, have you been humiliated or emotionally abused in other ways by your partner or ex-partner?: No    Within the last year, have you been kicked, hit, slapped, or otherwise physically hurt by your partner or ex-partner?: No    Within the last year, have you been raped or forced to have any kind of sexual activity by your partner or ex-partner?: No      Review of Systems  Constitutional:  Negative for chills, fatigue and unexpected weight change.  HENT:  Negative for congestion, rhinorrhea, sneezing and sore throat.   Eyes:  Negative for redness.  Respiratory:  Negative for cough, chest tightness and shortness of breath.   Cardiovascular:  Negative for chest pain and palpitations.  Gastrointestinal:  Negative for abdominal pain, constipation, diarrhea, nausea and vomiting.  Genitourinary:  Negative for dysuria and frequency.  Musculoskeletal:  Positive for arthralgias. Negative for back pain and neck pain.  Skin:  Negative for rash.  Neurological:  Negative.  Negative for tremors and numbness.  Hematological:  Negative for adenopathy. Does not bruise/bleed easily.  Psychiatric/Behavioral:  Negative for behavioral problems (Depression), sleep disturbance and suicidal ideas. The patient is not nervous/anxious.     Vital Signs: BP 135/70   Pulse 69   Temp 98 F (36.7 C)   Resp 16   Ht 5' 4 (1.626 m)   Wt 180 lb 6.4 oz (81.8 kg)   SpO2 97%   BMI 30.97 kg/m    Physical Exam Vitals and nursing note reviewed.  Constitutional:      General: He is not in acute distress.    Appearance: He is well-developed. He is not diaphoretic.  HENT:     Head: Normocephalic and atraumatic.  Eyes:     Extraocular Movements: Extraocular movements intact.  Neck:     Thyroid: No thyromegaly.  Vascular: No JVD.     Trachea: No tracheal deviation.  Cardiovascular:     Rate and Rhythm: Normal rate and regular rhythm.     Heart sounds: Normal heart sounds. No murmur heard.    No friction rub. No gallop.  Pulmonary:     Effort: Pulmonary effort is normal. No respiratory distress.     Breath sounds: No wheezing or rales.  Chest:     Chest wall: No tenderness.  Skin:    General: Skin is warm and dry.  Neurological:     General: No focal deficit present.     Mental Status: He is alert.  Psychiatric:        Behavior: Behavior normal.        Thought Content: Thought content normal.        Judgment: Judgment normal.        Assessment/Plan: 1. Type 2 diabetes mellitus with hyperglycemia, without long-term current use of insulin (HCC) (Primary) - POCT HgB A1C is 6.1 which is stable from last visit without medication and will continue to control with diet and exercise  2. Acute gout of multiple sites, unspecified cause May use colchicine as needed for flare. If becoming more frequent then will consider preventive such as allopurinol - colchicine 0.6 MG tablet; Take 2 tablets by mouth at start of gout flare. Then may take 1 tablet by  mouth daily for 7 days.  Dispense: 30 tablet; Refill: 1  3. Essential hypertension Stable, continue current medication - lisinopril  (ZESTRIL ) 20 MG tablet; Take 1 tablet (20 mg total) by mouth daily.  Dispense: 90 tablet; Refill: 3  4. Diabetic eye exam [facility]) - Ambulatory referral to Ophthalmology  5. Need for shingles vaccine - Zoster Vaccine Adjuvanted [facility]) injection; Inject 0.5 mLs into the muscle once for 1 dose.  Dispense: 0.5 mL; Refill: 0  6. Need for pneumococcal vaccine - pneumococcal 20-valent conjugate vaccine (PREVNAR 20) 0.5 ML injection; Inject 0.5 mLs into the muscle tomorrow at 10 am for 1 dose.  Dispense: 0.5 mL; Refill: 0   General Counseling: [name] verbalizes understanding of the findings of todays visit and agrees with plan of treatment. I have discussed any further diagnostic evaluation that may be needed or ordered today. We also reviewed his medications today. he has been encouraged to call the office with any questions or concerns that should arise related to todays visit.    Orders Placed This Encounter  Procedures   Ambulatory referral to Ophthalmology   POCT HgB A1C    Meds ordered this encounter  Medications   colchicine 0.6 MG tablet    Sig: Take 2 tablets by mouth at start of gout flare. Then may take 1 tablet by mouth daily for 7 days.    Dispense:  30 tablet    Refill:  1   lisinopril  (ZESTRIL ) 20 MG tablet    Sig: Take 1 tablet (20 mg total) by mouth daily.    Dispense:  90 tablet    Refill:  3   Zoster Vaccine Adjuvanted [facility]) injection    Sig: Inject 0.5 mLs into the muscle once for 1 dose.    Dispense:  0.5 mL    Refill:  0   pneumococcal 20-valent conjugate vaccine (PREVNAR 20) 0.5 ML injection    Sig: Inject 0.5 mLs into the muscle tomorrow at 10 am for 1 dose.    Dispense:  0.5 mL    Refill:  0    This patient was seen  by Tinnie Pro, PA-C in collaboration with Dr. Sigrid Bathe as a part of collaborative care  agreement.   Total time spent:30 Minutes Time spent includes review of chart, medications, test results, and follow up plan with the patient.      Dr Fozia M Khan Internal medicine

## 2023-12-05 ENCOUNTER — Telehealth: Payer: Self-pay | Admitting: Physician Assistant

## 2023-12-05 NOTE — Telephone Encounter (Signed)
 Ophthalmology referral faxed to [facility]/ [place] due to insurance; [phone]. Notified daughter. Gave her telephone [phone]

## 2024-01-02 ENCOUNTER — Encounter: Admitting: Physician Assistant

## 2024-01-03 ENCOUNTER — Telehealth: Payer: Self-pay | Admitting: Physician Assistant

## 2024-01-03 NOTE — Telephone Encounter (Signed)
 Lvm  with daughter to reschedule 01/02/2024 missed appointment-Toni

## 2024-02-13 ENCOUNTER — Encounter: Admitting: Physician Assistant

## 2024-02-27 ENCOUNTER — Encounter: Admitting: Physician Assistant
